# Patient Record
Sex: Female | Born: 1985 | Race: White | Hispanic: No | Marital: Married | Smoking: Former smoker
Health system: Southern US, Community
[De-identification: ages and names within clinical notes are randomized; demographics above are authoritative.]

## PROBLEM LIST (undated history)

## (undated) ENCOUNTER — Inpatient Hospital Stay: Payer: Self-pay

## (undated) ENCOUNTER — Inpatient Hospital Stay (HOSPITAL_COMMUNITY): Payer: Self-pay

## (undated) DIAGNOSIS — R519 Headache, unspecified: Secondary | ICD-10-CM

## (undated) DIAGNOSIS — R51 Headache: Secondary | ICD-10-CM

## (undated) HISTORY — DX: Headache, unspecified: R51.9

## (undated) HISTORY — DX: Headache: R51

## (undated) HISTORY — PX: OVARIAN CYST REMOVAL: SHX89

## (undated) HISTORY — PX: TONSILLECTOMY: SUR1361

## (undated) HISTORY — PX: BREAST EXCISIONAL BIOPSY: SUR124

---
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
## 2007-07-29 ENCOUNTER — Ambulatory Visit: Payer: Self-pay | Admitting: General Surgery

## 2007-09-07 ENCOUNTER — Ambulatory Visit: Payer: Self-pay | Admitting: Unknown Physician Specialty

## 2007-09-08 ENCOUNTER — Ambulatory Visit: Payer: Self-pay | Admitting: Unknown Physician Specialty

## 2010-01-30 ENCOUNTER — Ambulatory Visit: Payer: Self-pay | Admitting: Obstetrics & Gynecology

## 2010-02-05 ENCOUNTER — Ambulatory Visit (HOSPITAL_COMMUNITY): Admission: RE | Admit: 2010-02-05 | Discharge: 2010-02-05 | Payer: Self-pay | Admitting: Family Medicine

## 2010-02-15 ENCOUNTER — Ambulatory Visit: Payer: Self-pay | Admitting: Obstetrics & Gynecology

## 2010-09-04 NOTE — Assessment & Plan Note (Signed)
[name], [name]                ACCOUNT NO.:  [id number]   MEDICAL RECORD NO.:  [id number]          PATIENT TYPE:  POB   LOCATION:  [facility] at [facility]         FACILITY:  [facility]   PHYSICIAN:  Scheryl Darter, MD       DATE OF BIRTH:  [birth year]   DATE OF SERVICE:  02/15/2010                                  CLINIC NOTE   CHIEF COMPLAINT:  Lower abdominal pain.   The patient is 25 year old gravida 0 who occasionally has some light  spotting which may represent a period with Mirena in place.  She has  pain that occurs about every 4 weeks and fairly severe, sometimes she  takes Excedrin or ibuprofen.  Ultrasound was performed at Dr. Ellin Saba  request.  The intrauterine device was in the expected location and  ovaries appeared normal.  I discussed the patient's symptoms which may  be ovulatory pain.  Recommend that she use ibuprofen or Aleve for this  pain and she should notify us if pain becomes more debilitating.  She  will return for yearly exams.      Scheryl Darter, MD     JA/MEDQ  D:  02/15/2010  T:  02/15/2010  Job:  [id number]

## 2010-09-04 NOTE — Assessment & Plan Note (Signed)
NAMEMELISIA, LEMING                ACCOUNT NO.:  [id number]   MEDICAL RECORD NO.:  [id number]          PATIENT TYPE:  POB   LOCATION:  [facility] at [facility]         FACILITY:  [facility]   PHYSICIAN:  Allie Bossier, MD        DATE OF BIRTH:  Jan 20, 1986   DATE OF SERVICE:  01/30/2010                                  CLINIC NOTE   Brenda Gutierrez is a 25 year old single white gravida 0 who comes in here for  annual exam.  Her complaint today is bilateral lower pelvic pain for  last 3 months.  She says it feels like an ovarian cyst.  She has had  previous ovarian cyst, one of which required a laparoscopy and ovarian  cystectomy in 2009 by a physician at [facility].   PAST MEDICAL HISTORY:  Morbid obesity.   PAST SURGICAL HISTORY:  As above plus right breast biopsy in 2004.  Also  in 2004, she had removed what sounds like either a parathyroid or a  thyroid gland.  Please note, she does not take thyroid medicine.  Tonsillectomy and bilateral tympanostomy x3.   ALLERGIES:  No known drug allergies.   SOCIAL HISTORY:  She drinks alcohol rarely.   FAMILY HISTORY:  Positive for breast cancer in a maternal great-aunt,  but no gynecologic or colon cancer in her family.   MEDICATIONS:  She has a Mirena IUD in place.   PHYSICAL EXAMINATION:  GENERAL:  Well-nourished, well-hydrated, pleasant  white female.  VITAL SIGNS:  Weight 212 pounds.  HEENT:  Normal.  BREAST:  Normal bilaterally.  Her right nipple is inverted.  Left nipple  is flat.  No other nipple abnormalities, nipple discharge, skin changes,  or palpable masses.  HEART:  Regular rate and rhythm.  LUNGS:  Clear to auscultation bilaterally.  ABDOMEN:  Obese, benign.  No palpable hepatosplenomegaly.  EXTERNAL GENITALIA:  No lesions.  Cervix, IUD string seen.  Bimanual  exam, this exam is somewhat limited by her morbid centripetal obesity,  but I do not appreciate any masses of either her adnexa or uterus.  There does not appear to be any  tenderness on exam.   ASSESSMENT AND PLAN:  1. Annual exam.  I have checked Pap smear.  Recommend self-breast and      self-vulvar exams.  2. Bilateral pelvic pain.  I am ordering a gynecologic ultrasound.  I      will have her followup after that.      Allie Bossier, MD     MCD/MEDQ  D:  01/30/2010  T:  01/31/2010  Job:  [id number]

## 2011-04-13 ENCOUNTER — Ambulatory Visit: Payer: Self-pay

## 2012-10-12 ENCOUNTER — Ambulatory Visit (INDEPENDENT_AMBULATORY_CARE_PROVIDER_SITE_OTHER): Payer: 59 | Admitting: Obstetrics and Gynecology

## 2012-10-12 ENCOUNTER — Encounter: Payer: Self-pay | Admitting: Obstetrics and Gynecology

## 2012-10-12 VITALS — BP 120/73 | HR 75 | Ht 64.0 in | Wt 233.0 lb

## 2012-10-12 DIAGNOSIS — Z3049 Encounter for surveillance of other contraceptives: Secondary | ICD-10-CM

## 2012-10-12 DIAGNOSIS — Z30432 Encounter for removal of intrauterine contraceptive device: Secondary | ICD-10-CM

## 2012-10-12 DIAGNOSIS — Z3009 Encounter for other general counseling and advice on contraception: Secondary | ICD-10-CM

## 2012-10-12 MED ORDER — MEDROXYPROGESTERONE ACETATE 150 MG/ML IM SUSP: 150.0000 mg | INTRAMUSCULAR | Status: DC

## 2012-10-12 MED ORDER — MEDROXYPROGESTERONE ACETATE 150 MG/ML IM SUSP
150.0000 mg | INTRAMUSCULAR | Status: DC
  Administered 2012-10-12: 150 mg via INTRAMUSCULAR

## 2012-10-12 NOTE — Progress Notes (Signed)
Patient ID: Brenda Gutierrez, female   DOB: [birth year], 27 y.o.   MRN: [id number] Patient presents today requesting IUD removal. Patient has had it in place for nearly 5 years. She reports over the past 6 months severe mood swings which she attributes to the IUD and desires to try depo-provera instead. Patient is contemplating conception in 1 year  IUD removal  After informed consent was obtained, a speculum was placed in the vagina. IUD strings visualized at os and grasped with a ring forceps. IUD was removed without difficulty. Patient tolerated the procedure well.  Patient to receive first dose of depo-provera today. Patient advised to use back-up birth control for the next 2-3 weeks RTC in 3 months for depo-provera Patient advised to start prenatal vitamins a few months prior to trying to conceive

## 2012-10-14 ENCOUNTER — Ambulatory Visit: Payer: Self-pay | Admitting: Family Medicine

## 2013-01-04 ENCOUNTER — Ambulatory Visit (INDEPENDENT_AMBULATORY_CARE_PROVIDER_SITE_OTHER): Payer: 59 | Admitting: *Deleted

## 2013-01-04 DIAGNOSIS — Z3042 Encounter for surveillance of injectable contraceptive: Secondary | ICD-10-CM

## 2013-01-04 DIAGNOSIS — Z3049 Encounter for surveillance of other contraceptives: Secondary | ICD-10-CM

## 2013-01-04 MED ORDER — MEDROXYPROGESTERONE ACETATE 150 MG/ML IM SUSP: 150.0000 mg | INTRAMUSCULAR | Status: DC

## 2013-01-04 NOTE — Progress Notes (Signed)
Patient was told to come to the office to pick up Depo Provera injection rather than calling it into her pharmacy.  I am unsure of why this was told to her but we will give her an injection today from the office and further refills will be called into her pharmacy for her to pick up.  Patient administers the injection herself so there will not be any injection fee today, the only charge is for the Depo Provera injection itself.  Further meds will be called into the pharmacy and picked up by the patient and injections given by the patient.  She is not due to follow up until next year in June.

## 2014-01-04 DIAGNOSIS — G43019 Migraine without aura, intractable, without status migrainosus: Secondary | ICD-10-CM

## 2014-01-04 HISTORY — DX: Migraine without aura, intractable, without status migrainosus: G43.019

## 2014-12-15 ENCOUNTER — Encounter: Payer: Self-pay | Admitting: Obstetrics and Gynecology

## 2014-12-15 ENCOUNTER — Ambulatory Visit (INDEPENDENT_AMBULATORY_CARE_PROVIDER_SITE_OTHER): Payer: BLUE CROSS/BLUE SHIELD | Admitting: Obstetrics and Gynecology

## 2014-12-15 VITALS — BP 116/72 | HR 88 | Resp 16 | Ht 64.0 in | Wt 221.9 lb

## 2014-12-15 DIAGNOSIS — N912 Amenorrhea, unspecified: Secondary | ICD-10-CM | POA: Diagnosis not present

## 2014-12-15 LAB — POCT URINE PREGNANCY: PREG TEST UR: POSITIVE — AB

## 2014-12-15 NOTE — Progress Notes (Signed)
Subjective:    Brenda Gutierrez is a 29 y.o. P0 female who presents for evaluation of amenorrhea. She believes she could be pregnant. Pregnancy is desired. Sexual Activity: single partner, contraception: none. Current symptoms also include: breast tenderness, fatigue, morning sickness, nausea and positive home pregnancy test. Last period was normal.   Patient's last menstrual period was 10/25/2014 (exact date).   History reviewed. No pertinent past medical history.  Past Surgical History  Procedure Laterality Date  . Tonsillectomy     Family History  Problem Relation Age of Onset  . Hypertension Paternal Grandfather   . Hyperlipidemia Paternal Grandfather   . Hypertension Paternal Grandmother   . Hyperlipidemia Paternal Grandmother   . Depression Father   . Hypertension Father   . Diabetes Father   . Bipolar disorder Father   . Hyperlipidemia Father   . Diabetes Mother   . Depression Mother   . Anxiety disorder Mother   . Breast cancer Paternal Aunt     PRN Meds:. Allergies  Allergen Reactions  . Insect Extract Anaphylaxis  . Penicillin G Anaphylaxis  . Penicillins Anaphylaxis  . Amoxicillin-Pot Clavulanate Hives    Review of Systems Pertinent items are noted in HPI.     Objective:    BP 116/72 mmHg  Pulse 88  Resp 16  Ht  (1.626 m)  Wt 221 lb 14.4 oz (100.653 kg)  BMI 38.07 kg/m2  LMP 10/25/2014 (Exact Date) General: alert, no distress and no acute distress    Lab Review Urine HCG: positive    Assessment:    Absence of menstruation.     Plan:    Pregnancy Test: Positive: EDC: 08/01/2015. Briefly discussed pre-natal care options.Encouraged well-balanced diet, plenty of rest when needed, pre-natal vitamins daily and walking for exercise. Discussed self-help for nausea, avoiding OTC medications until consulting provider or pharmacist, other than Tylenol as needed, minimal caffeine (1-2 cups daily) and avoiding alcohol. She will schedule her initial OB  visit in the next month with her PCP or OB provider. Feel free to call with any questions. Will order ultrasound to confirm viability in next 1-2 weeks.     Hildred Laser, MD [facility]

## 2014-12-27 ENCOUNTER — Ambulatory Visit: Payer: BLUE CROSS/BLUE SHIELD

## 2014-12-27 DIAGNOSIS — N912 Amenorrhea, unspecified: Secondary | ICD-10-CM | POA: Diagnosis not present

## 2015-01-05 ENCOUNTER — Other Ambulatory Visit: Payer: Self-pay | Admitting: Obstetrics and Gynecology

## 2015-01-05 ENCOUNTER — Ambulatory Visit (INDEPENDENT_AMBULATORY_CARE_PROVIDER_SITE_OTHER): Payer: BLUE CROSS/BLUE SHIELD | Admitting: Obstetrics and Gynecology

## 2015-01-05 VITALS — BP 112/74 | HR 96 | Wt 220.2 lb

## 2015-01-05 DIAGNOSIS — R638 Other symptoms and signs concerning food and fluid intake: Secondary | ICD-10-CM

## 2015-01-05 DIAGNOSIS — T7589XA Other specified effects of external causes, initial encounter: Secondary | ICD-10-CM

## 2015-01-05 DIAGNOSIS — Z113 Encounter for screening for infections with a predominantly sexual mode of transmission: Secondary | ICD-10-CM

## 2015-01-05 DIAGNOSIS — Z1389 Encounter for screening for other disorder: Secondary | ICD-10-CM

## 2015-01-05 DIAGNOSIS — Z331 Pregnant state, incidental: Secondary | ICD-10-CM

## 2015-01-05 DIAGNOSIS — Z36 Encounter for antenatal screening of mother: Secondary | ICD-10-CM

## 2015-01-05 DIAGNOSIS — Z369 Encounter for antenatal screening, unspecified: Secondary | ICD-10-CM

## 2015-01-05 NOTE — Patient Instructions (Signed)

## 2015-01-05 NOTE — Progress Notes (Signed)
  Lavonna Rua for NOB nurse interview visit. G-1.   P-0. Pregnancy eduction material explained and given. There is a cat in the home, although it usually stays outside. Pt states she has not changed litter box in several months. Toxoplamosis labs ordered. Pt agreed, cost from [facility] was reported est. $91.00.  NOB labs ordered. TSH/HbgA1c due to Increased BMI, HIV labs ordered. Due to high deductable, Drug screen was declined. HIV lab and drug screen were explained optional and she could opt out of tests but did not decline HIV.  PNV encouraged. NT to discuss with provider. Pt also questioned if she could take Iodine supplement. I spoke with Dr. Algis Downs and she may take . Maximum dose.  She takes this as suggested by her PCP for abnormal lower body temp., sometimes Temp. 95. Pt has fever, and severe fatigue when these episodes occur.  This also may depend on cost. Pt. To follow up with provider in 1-2 weeks for NOB physical.  All questions answered.  ZIKA EXPOSURE SCREEN:  The patient has not traveled to a [place] Virus endemic area within the past 6 months, nor has she had unprotected sex with a partner who has travelled to a [place] endemic region within the past 6 months. The patient has been advised to notify us if these factors change any time during this current pregnancy, so adequate testing and monitoring can be initiated.

## 2015-01-06 LAB — ABO/RH: RH TYPE: POSITIVE

## 2015-01-06 LAB — UA/M W/RFLX CULTURE, COMP

## 2015-01-06 LAB — GC/CHLAMYDIA PROBE AMP
Chlamydia trachomatis, NAA: NEGATIVE
Neisseria gonorrhoeae by PCR: NEGATIVE

## 2015-01-06 LAB — HIV ANTIBODY (ROUTINE TESTING W REFLEX): HIV Screen 4th Generation wRfx: NONREACTIVE

## 2015-01-06 LAB — MICROSCOPIC EXAMINATION: Casts: NONE SEEN /lpf

## 2015-01-06 LAB — CBC WITH DIFFERENTIAL/PLATELET
BASOS: 0 %
Basophils Absolute: 0 10*3/uL (ref 0.0–0.2)
EOS (ABSOLUTE): 0.1 10*3/uL (ref 0.0–0.4)
Eos: 1 %
Hematocrit: 42.2 % (ref 34.0–46.6)
Hemoglobin: 13.6 g/dL (ref 11.1–15.9)
Immature Grans (Abs): 0 10*3/uL (ref 0.0–0.1)
Immature Granulocytes: 0 %
LYMPHS ABS: 2.6 10*3/uL (ref 0.7–3.1)
Lymphs: 19 %
MCH: 29 pg (ref 26.6–33.0)
MCHC: 32.2 g/dL (ref 31.5–35.7)
MCV: 90 fL (ref 79–97)
MONOS ABS: 0.7 10*3/uL (ref 0.1–0.9)
Monocytes: 5 %
NEUTROS ABS: 9.8 10*3/uL — AB (ref 1.4–7.0)
NEUTROS PCT: 75 %
PLATELETS: 312 10*3/uL (ref 150–379)
RBC: 4.69 x10E6/uL (ref 3.77–5.28)
RDW: 13.5 % (ref 12.3–15.4)
WBC: 13.3 10*3/uL — ABNORMAL HIGH (ref 3.4–10.8)

## 2015-01-06 LAB — RUBELLA SCREEN: Rubella Antibodies, IGG: <0.9 index — ABNORMAL LOW (ref >0.99)

## 2015-01-06 LAB — TOXOPLASMA GONDII ANTIBODY, IGG

## 2015-01-06 LAB — HEPATITIS B SURFACE ANTIGEN: HEP B S AG: NEGATIVE

## 2015-01-06 LAB — RPR QUALITATIVE: RPR: NONREACTIVE

## 2015-01-06 LAB — HGB A1C W/O EAG: HEMOGLOBIN A1C: 5.5 % (ref 4.8–5.6)

## 2015-01-06 LAB — ANTIBODY SCREEN: Antibody Screen: NEGATIVE

## 2015-01-06 LAB — URINE CULTURE

## 2015-01-06 LAB — TSH: TSH: 1.46 u[IU]/mL (ref 0.450–4.500)

## 2015-01-07 LAB — URINE CULTURE

## 2015-01-09 LAB — VARICELLA ZOSTER ANTIBODY, IGM: Varicella IgM: 1.06 index — ABNORMAL HIGH (ref 0.00–0.90)

## 2015-01-09 LAB — URINE CULTURE, COMPREHENSIVE

## 2015-01-10 LAB — UA/M W/RFLX CULTURE, COMP

## 2015-01-20 ENCOUNTER — Ambulatory Visit (INDEPENDENT_AMBULATORY_CARE_PROVIDER_SITE_OTHER): Payer: BLUE CROSS/BLUE SHIELD | Admitting: Obstetrics and Gynecology

## 2015-01-20 ENCOUNTER — Encounter: Payer: Self-pay | Admitting: Obstetrics and Gynecology

## 2015-01-20 VITALS — BP 120/69 | HR 82 | Wt 222.0 lb

## 2015-01-20 DIAGNOSIS — O99210 Obesity complicating pregnancy, unspecified trimester: Secondary | ICD-10-CM | POA: Insufficient documentation

## 2015-01-20 DIAGNOSIS — Z3491 Encounter for supervision of normal pregnancy, unspecified, first trimester: Secondary | ICD-10-CM | POA: Insufficient documentation

## 2015-01-20 DIAGNOSIS — Z3492 Encounter for supervision of normal pregnancy, unspecified, second trimester: Secondary | ICD-10-CM

## 2015-01-20 DIAGNOSIS — E669 Obesity, unspecified: Secondary | ICD-10-CM

## 2015-01-20 LAB — POCT URINALYSIS DIPSTICK
BILIRUBIN UA: NEGATIVE
GLUCOSE UA: NEGATIVE
Ketones, UA: NEGATIVE
Leukocytes, UA: NEGATIVE
Nitrite, UA: NEGATIVE
RBC UA: NEGATIVE
SPEC GRAV UA: 1.01
UROBILINOGEN UA: 0.2
pH, UA: 7

## 2015-01-20 NOTE — Progress Notes (Signed)
New OB History and Physical  Subjective:    Brenda Gutierrez is being seen today for her first obstetrical visit.  This is a planned pregnancy. She is a G1P0 married female at [redacted]w[redacted]d gestation by last menstrual period of 10/25/2014 (exact date), with Estimated Date of Delivery: 08/01/15 (consistent with 8 week sono). Her obstetrical history is significant for obesity. Relationship with FOB: spouse, living together. Patient does intend to breast feed. Pregnancy history fully reviewed.  Menstrual History: OB History  Gravida Para Term Preterm AB SAB TAB Ectopic Multiple Living  1             # Outcome Date GA Lbr Len/2nd Weight Sex Delivery Anes PTL Lv  1 Current              Menarche age: 3  Patient's last menstrual period was 10/25/2014 (exact date).  Denies h/o STIs or abnormal pap smears.  Patient cannot recall date of last pap smear (possibly 2-3 years ago).    Past Medical History  Diagnosis Date  . Headache     migraines    Past Surgical History  Procedure Laterality Date  . Tonsillectomy    . 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 Tumor removal  2005    right breast-benign    Family History  Problem Relation Age of Onset  . Hypertension Paternal Grandfather   . Hyperlipidemia Paternal Grandfather   . Hypertension Paternal Grandmother   . Hyperlipidemia Paternal Grandmother   . Depression Father   . Hypertension Father   . Diabetes Father   . Bipolar disorder Father   . Hyperlipidemia Father   . Diabetes Mother   . Depression Mother   . Anxiety disorder Mother   . Breast cancer Paternal Aunt     Social History   Social History  . Marital Status: Married    Spouse Name: N/A  . Number of Children: N/A  . Years of Education: N/A   Occupational History  . fitness Psychologist, clinical   Social History Main Topics  . Smoking status: Former Games developer  . Smokeless tobacco: Never Used  . Alcohol Use: No     Comment: social  . Drug Use: No  . Sexual  Activity:    Partners: Male   Other Topics Concern  . Not on file   Social History Narrative    Current Outpatient Prescriptions on File Prior to Visit  Medication Sig Dispense Refill  . EPINEPHrine 0.3 mg/0.3 mL IJ SOAJ injection Inject into the muscle.    . Prenatal Vit-Fe Fumarate-FA (MULTIVITAMIN-PRENATAL) 27-0.8 MG TABS tablet Take 1 tablet by mouth daily at 12 noon.     No current facility-administered medications on file prior to visit.    Allergies  Allergen Reactions  . Insect Extract Anaphylaxis  . Penicillin G Anaphylaxis  . Penicillins Anaphylaxis  . Amoxicillin-Pot Clavulanate Hives    Review of Systems General:Not Present- Fever, Weight Loss and Weight Gain. Skin:Not Present- Rash. HEENT:Not Present- Blurred Vision, Headache and Bleeding Gums. Respiratory:Not Present- Difficulty Breathing. Breast:Not Present- Breast Mass. Cardiovascular:Not Present- Chest Pain, Elevated Blood Pressure, Fainting / Blacking Out and Shortness of Breath. Gastrointestinal:Present - Nausea and Vomiting (mild, resolving). Not Present- Abdominal Pain, Constipation. Female Genitourinary:Not Present- Frequency, Painful Urination, Pelvic Pain, Vaginal Bleeding, Vaginal Discharge, Contractions, regular, Fetal Movements Decreased, Urinary Complaints and Vaginal Fluid. Musculoskeletal:Not Present- Back Pain and Leg Cramps. Neurological:Not Present- Dizziness. Psychiatric:Not Present- Depression.    Objective:  Blood pressure 120/69, pulse 82, weight 222 lb (100.699 kg), last menstrual period 10/25/2014. Body mass index is 38.09 kg/(m^2).   General Appearance:    Alert, cooperative, no distress, appears stated age, obese  Head:    Normocephalic, without obvious abnormality, atraumatic  Eyes:    PERRL, conjunctiva/corneas clear, EOM's intact, both eyes  Ears:    Normal external ear canals, both ears  Nose:   Nares normal, septum midline, mucosa normal, no drainage or sinus  tenderness  Throat:   Lips, mucosa, and tongue normal; teeth and gums normal  Neck:   Supple, symmetrical, trachea midline, no adenopathy; thyroid: no enlargement/tenderness/nodules; no carotid bruit or JVD  Back:     Symmetric, no curvature, ROM normal, no CVA tenderness  Lungs:     Clear to auscultation bilaterally, respirations unlabored  Chest Wall:    No tenderness or deformity   Heart:    Regular rate and rhythm, S1 and S2 normal, no murmur, rub or gallop  Breast Exam:    No tenderness, masses, or nipple abnormality  Abdomen:     Soft, non-tender, bowel sounds active all four quadrants, no masses, no organomegaly.  FHT 153  bpm.  Genitalia:    Pelvic:external genitalia normal, vagina with lesions, discharge, or tenderness, rectovaginal septum  normal. Cervix normal in appearance, no cervical motion tenderness, no adnexal masses or tenderness.  Pregnancy positive findings: uterine enlargement: 12 week size, nontender.   Rectal:    Normal external sphincter.  No hemorrhoids appreciated. Internal exam not done.   Extremities:   Extremities normal, atraumatic, no cyanosis or edema  Pulses:   2+ and symmetric all extremities  Skin:   Skin color, texture, turgor normal, no rashes or lesions  Lymph nodes:   Cervical, supraclavicular, and axillary nodes normal  Neurologic:   CNII-XII intact, normal strength, sensation and reflexes throughout     Assessment:   Pregnancy at 12 and 5/7 weeks   Obesity H/o migraines   Plan:   Initial labs reviewed.  Pap smear performed today.  Prenatal vitamins. Problem list reviewed and updated. AFP3 discussed: undecided (due to insurance and cost). If unable to perform 1st trimester screen, to discuss further next visit (can perform 2nd trimester screen or Panor[facility] at that time).  Needs early glucola for screening for diabetes due to obesity.  H/o migraines.  Currently asymptomatic.  Advised that migraine frequency and intensity can change in pregnancy, to  notify MD if symptoms worsen.  Follow up in 4 weeks.

## 2015-01-20 NOTE — Progress Notes (Signed)
NOB- pt denies any changes

## 2015-01-24 ENCOUNTER — Other Ambulatory Visit: Payer: BLUE CROSS/BLUE SHIELD

## 2015-01-25 LAB — PAP IG AND HPV HIGH-RISK: PAP SMEAR COMMENT: 0

## 2015-01-26 DIAGNOSIS — O09899 Supervision of other high risk pregnancies, unspecified trimester: Secondary | ICD-10-CM

## 2015-01-26 DIAGNOSIS — Z283 Underimmunization status: Secondary | ICD-10-CM

## 2015-01-26 DIAGNOSIS — O9989 Other specified diseases and conditions complicating pregnancy, childbirth and the puerperium: Secondary | ICD-10-CM

## 2015-02-03 ENCOUNTER — Encounter: Payer: Self-pay | Admitting: Obstetrics and Gynecology

## 2015-02-06 ENCOUNTER — Encounter: Payer: Self-pay | Admitting: Obstetrics and Gynecology

## 2015-02-06 ENCOUNTER — Telehealth: Payer: Self-pay | Admitting: *Deleted

## 2015-02-06 NOTE — Telephone Encounter (Signed)
Called [facility], had gender added, pt was notified

## 2015-02-06 NOTE — Telephone Encounter (Signed)
-----   Message from Hildred LaserAnika Cherry, MD sent at 02/04/2015 10:47 AM EDT ----- Nickolaus Bordelon, the fetal sex was not reported  i do believe the patient desired this information.  Can we call the [facility] lab and add this?  Thanks!   Dr. Valentino Saxonherry

## 2015-02-14 ENCOUNTER — Other Ambulatory Visit: Payer: Self-pay

## 2015-02-14 ENCOUNTER — Ambulatory Visit (INDEPENDENT_AMBULATORY_CARE_PROVIDER_SITE_OTHER): Payer: BLUE CROSS/BLUE SHIELD | Admitting: Obstetrics and Gynecology

## 2015-02-14 ENCOUNTER — Other Ambulatory Visit: Payer: BLUE CROSS/BLUE SHIELD

## 2015-02-14 VITALS — BP 138/80 | HR 103 | Wt 222.4 lb

## 2015-02-14 DIAGNOSIS — Z3492 Encounter for supervision of normal pregnancy, unspecified, second trimester: Secondary | ICD-10-CM

## 2015-02-14 DIAGNOSIS — Z23 Encounter for immunization: Secondary | ICD-10-CM

## 2015-02-14 DIAGNOSIS — Z3482 Encounter for supervision of other normal pregnancy, second trimester: Secondary | ICD-10-CM

## 2015-02-14 DIAGNOSIS — O99210 Obesity complicating pregnancy, unspecified trimester: Secondary | ICD-10-CM

## 2015-02-14 DIAGNOSIS — Z3402 Encounter for supervision of normal first pregnancy, second trimester: Secondary | ICD-10-CM

## 2015-02-14 DIAGNOSIS — E669 Obesity, unspecified: Secondary | ICD-10-CM

## 2015-02-14 DIAGNOSIS — Z1379 Encounter for other screening for genetic and chromosomal anomalies: Secondary | ICD-10-CM

## 2015-02-14 DIAGNOSIS — Z3403 Encounter for supervision of normal first pregnancy, third trimester: Secondary | ICD-10-CM | POA: Insufficient documentation

## 2015-02-14 LAB — POCT URINALYSIS DIP (MANUAL ENTRY)
BILIRUBIN UA: NEGATIVE
GLUCOSE UA: NEGATIVE
Ketones, POC UA: NEGATIVE
Leukocytes, UA: NEGATIVE
NITRITE UA: NEGATIVE
PH UA: 6
Protein Ur, POC: NEGATIVE
RBC UA: NEGATIVE
SPEC GRAV UA: 1.015
Urobilinogen, UA: 0.2

## 2015-02-14 NOTE — Progress Notes (Signed)
ROB: Doing well, no complaints.  Performing early glucola today.  For flu vaccine today. RTC in 4 weeks. For anatomy scan at that time.

## 2015-02-15 LAB — GLUCOSE, 1 HOUR GESTATIONAL: Gestational Diabetes Screen: 76 mg/dL (ref 65–139)

## 2015-02-16 ENCOUNTER — Other Ambulatory Visit: Payer: BLUE CROSS/BLUE SHIELD

## 2015-02-21 LAB — AFP, SERUM, OPEN SPINA BIFIDA
AFP MOM: 1.49
AFP VALUE AFPOSL: 39.5 ng/mL
Gest. Age on Collection Date: 16.3 weeks
MATERNAL AGE AT EDD: 30.4 a
OSBR Risk 1 IN: 2809
Test Results:: NEGATIVE
Weight: 220 [lb_av]

## 2015-03-15 ENCOUNTER — Other Ambulatory Visit: Payer: BLUE CROSS/BLUE SHIELD

## 2015-03-15 ENCOUNTER — Ambulatory Visit (INDEPENDENT_AMBULATORY_CARE_PROVIDER_SITE_OTHER): Payer: BLUE CROSS/BLUE SHIELD | Admitting: Obstetrics and Gynecology

## 2015-03-15 VITALS — BP 111/70 | HR 90 | Wt 230.4 lb

## 2015-03-15 DIAGNOSIS — O99210 Obesity complicating pregnancy, unspecified trimester: Secondary | ICD-10-CM

## 2015-03-15 DIAGNOSIS — Z3492 Encounter for supervision of normal pregnancy, unspecified, second trimester: Secondary | ICD-10-CM

## 2015-03-15 LAB — POCT URINALYSIS DIPSTICK
BILIRUBIN UA: NEGATIVE
Glucose, UA: NEGATIVE
KETONES UA: NEGATIVE
Leukocytes, UA: NEGATIVE
NITRITE UA: NEGATIVE
PH UA: 6
Protein, UA: NEGATIVE
RBC UA: NEGATIVE
Spec Grav, UA: >=1.03
Urobilinogen, UA: NEGATIVE

## 2015-03-15 NOTE — Progress Notes (Signed)
ROB: Doing well.  Notes that she was concerned regarding weight gain as she had not been gaining much weight over past few visits, however now is ok with current weight gain.  Also notes itching on back near area of bra strap.  Scaling peeling skin noted, advised on Aveeno or cocoa butter to area.  RTC in 1 week for anatomy scan in 4 weeks for Ob visit.

## 2015-03-21 ENCOUNTER — Ambulatory Visit (INDEPENDENT_AMBULATORY_CARE_PROVIDER_SITE_OTHER): Payer: BLUE CROSS/BLUE SHIELD

## 2015-03-21 DIAGNOSIS — Z3482 Encounter for supervision of other normal pregnancy, second trimester: Secondary | ICD-10-CM | POA: Diagnosis not present

## 2015-03-21 DIAGNOSIS — Z3492 Encounter for supervision of normal pregnancy, unspecified, second trimester: Secondary | ICD-10-CM

## 2015-03-28 ENCOUNTER — Other Ambulatory Visit: Payer: Self-pay | Admitting: Obstetrics and Gynecology

## 2015-03-28 DIAGNOSIS — IMO0002 Reserved for concepts with insufficient information to code with codable children: Secondary | ICD-10-CM

## 2015-03-28 DIAGNOSIS — Z0489 Encounter for examination and observation for other specified reasons: Secondary | ICD-10-CM

## 2015-03-29 ENCOUNTER — Ambulatory Visit (INDEPENDENT_AMBULATORY_CARE_PROVIDER_SITE_OTHER): Payer: BLUE CROSS/BLUE SHIELD

## 2015-03-29 DIAGNOSIS — Z0489 Encounter for examination and observation for other specified reasons: Secondary | ICD-10-CM

## 2015-03-29 DIAGNOSIS — Z36 Encounter for antenatal screening of mother: Secondary | ICD-10-CM

## 2015-03-29 DIAGNOSIS — IMO0002 Reserved for concepts with insufficient information to code with codable children: Secondary | ICD-10-CM

## 2015-04-10 ENCOUNTER — Ambulatory Visit (INDEPENDENT_AMBULATORY_CARE_PROVIDER_SITE_OTHER): Payer: BLUE CROSS/BLUE SHIELD | Admitting: Certified Nurse Midwife

## 2015-04-10 ENCOUNTER — Encounter: Payer: Self-pay | Admitting: Certified Nurse Midwife

## 2015-04-10 VITALS — BP 110/74 | HR 93 | Wt 235.2 lb

## 2015-04-10 DIAGNOSIS — Z1389 Encounter for screening for other disorder: Secondary | ICD-10-CM

## 2015-04-10 DIAGNOSIS — O99210 Obesity complicating pregnancy, unspecified trimester: Secondary | ICD-10-CM

## 2015-04-10 DIAGNOSIS — Z3490 Encounter for supervision of normal pregnancy, unspecified, unspecified trimester: Secondary | ICD-10-CM

## 2015-04-10 DIAGNOSIS — Z369 Encounter for antenatal screening, unspecified: Secondary | ICD-10-CM

## 2015-04-10 DIAGNOSIS — Z331 Pregnant state, incidental: Secondary | ICD-10-CM

## 2015-04-10 DIAGNOSIS — Z36 Encounter for antenatal screening of mother: Secondary | ICD-10-CM

## 2015-04-10 LAB — POCT URINALYSIS DIPSTICK
BILIRUBIN UA: NEGATIVE
Blood, UA: NEGATIVE
Glucose, UA: NEGATIVE
Ketones, UA: NEGATIVE
NITRITE UA: NEGATIVE
PH UA: 6
PROTEIN UA: NEGATIVE
Spec Grav, UA: 1.025
Urobilinogen, UA: NEGATIVE

## 2015-04-10 NOTE — Patient Instructions (Signed)

## 2015-04-10 NOTE — Progress Notes (Signed)
Pt took a new medication as stated in nursing note and began itching and having a rash. Rash only visible on hands and no where else. Pt advised to stop medication and to continue cortizone cream TID and use benadryl PRN.  If benadryl causes too much somnolence may use zyrtec.  Pt to return in 4 weeks for ROB and GTT.  Discussed diet & exercise and weight gain.  Pt denies headache, visual disturbances or epigastric pain.

## 2015-04-10 NOTE — Progress Notes (Signed)
Pt presents for rash/itching of hands (also swollen), stomach, top of upper legs. Tried Benadryl po which did not help nor the Cortizone cream, much.  Pt has been sick and recently took Robitussin DM, cold and chest congestion. This contains Dextromethorphan HBr 20mg . And Guaifenesin 200mg . Pt took this 2x on Saturday and started itching on Saturday night. Head cold has gone into chest. Cough is non-productive. Temp. 97.3 Oral.

## 2015-04-12 ENCOUNTER — Other Ambulatory Visit: Payer: Self-pay | Admitting: Obstetrics and Gynecology

## 2015-04-12 DIAGNOSIS — IMO0002 Reserved for concepts with insufficient information to code with codable children: Secondary | ICD-10-CM

## 2015-04-12 DIAGNOSIS — Z0489 Encounter for examination and observation for other specified reasons: Secondary | ICD-10-CM

## 2015-04-13 ENCOUNTER — Ambulatory Visit (INDEPENDENT_AMBULATORY_CARE_PROVIDER_SITE_OTHER): Payer: BLUE CROSS/BLUE SHIELD

## 2015-04-13 ENCOUNTER — Ambulatory Visit (INDEPENDENT_AMBULATORY_CARE_PROVIDER_SITE_OTHER): Payer: BLUE CROSS/BLUE SHIELD | Admitting: Obstetrics and Gynecology

## 2015-04-13 ENCOUNTER — Encounter: Payer: Self-pay | Admitting: Obstetrics and Gynecology

## 2015-04-13 VITALS — BP 113/70 | HR 109 | Wt 235.9 lb

## 2015-04-13 DIAGNOSIS — Z3493 Encounter for supervision of normal pregnancy, unspecified, third trimester: Secondary | ICD-10-CM

## 2015-04-13 DIAGNOSIS — Z36 Encounter for antenatal screening of mother: Secondary | ICD-10-CM

## 2015-04-13 DIAGNOSIS — Z0489 Encounter for examination and observation for other specified reasons: Secondary | ICD-10-CM

## 2015-04-13 DIAGNOSIS — IMO0002 Reserved for concepts with insufficient information to code with codable children: Secondary | ICD-10-CM

## 2015-04-13 LAB — POCT URINALYSIS DIPSTICK
BILIRUBIN UA: NEGATIVE
Blood, UA: NEGATIVE
GLUCOSE UA: NEGATIVE
KETONES UA: NEGATIVE
LEUKOCYTES UA: NEGATIVE
Nitrite, UA: NEGATIVE
Spec Grav, UA: 1.015
Urobilinogen, UA: 0.2
pH, UA: 5

## 2015-04-13 NOTE — Progress Notes (Signed)
ROB- follow-up anatomy done, still has rash on her hands

## 2015-04-13 NOTE — Progress Notes (Signed)
Rob- rash barely visible, reassured, scan complete today and reveals the following: Indications:F/U Anatomy Findings:  Follow Up to complete anatomy: spine only Fetal presentation is Vertex. . Placenta: Anterior, grade 0, remote to cervix by 6.8cm. AFI: Adequate with MVP of 5.5cm.  Anatomic survey is complete and normal; Gender - female  .   Survey of the adnexa demonstrates no adnexal masses. There is no free peritoneal fluid in the cul de sac.  Impression: 1. Spine appears normal   Glucola next visit.

## 2015-04-23 NOTE — L&D Delivery Note (Signed)
Delivery Summary for Brenda Gutierrez  Labor Events:   Preterm labor:   Rupture date:   Rupture time:   Rupture type: Possible ROM - for evaluation  Fluid Color:   Induction:   Augmentation:   Complications:   Cervical ripening:          Delivery:   Episiotomy:   Lacerations:   Repair suture:   Repair # of packets:   Blood loss (ml): 600    Information for the patient's newborn:  Brenda Gutierrez, Brenda Gutierrez [[id number]][[id number]]    Delivery 07/21/2015 9:07 PM by  C-Section, Low Vertical Sex:  female Gestational Age: 7286w3d Delivery Clinician:  Hildred LaserAnika Quinlan Mcfall Living?: Yes        APGARS  One minute Five minutes Ten minutes  Skin color: 0   1      Heart rate: 2   2      Grimace: 2   2      Muscle tone: 2   2      Breathing: 2   2      Totals: 8  9      Presentation/position: Vertex     Resuscitation: None  Cord information: 3 vessels   Disposition of cord blood: Yes    Blood gases sent?  Complications: None  Placenta: Delivered: 07/21/2015 9:09 PM  Manual removal  Intact appearance Newborn Measurements: Weight: 7 lb 4.8 oz (3310 g)  Height: 20.5"  Head circumference:    Chest circumference:    Other providers: Delivery Assist Delivery Nurse Transition RN Neonatologist Daphine DeutscherMartin A Defrancesco Geronimo BootAshley E Daniels Donna B Grubbs Peggy A Mccracken  Additional  information: Forceps:   Vacuum:   Breech:   Observed anomalies none observed         See Dr. Oretha Milchherry's Operative Note for details of procedure   Hildred LaserAnika Itzabella Sorrels, MD [facility]

## 2015-05-02 ENCOUNTER — Telehealth: Payer: Self-pay | Admitting: Obstetrics and Gynecology

## 2015-05-02 NOTE — Telephone Encounter (Signed)
Pt is [redacted] wk pregnant. She wanted to inform Dr. Valentino Saxonherry that she fell on the ice. She fell on her butt, no spotting, just a lil cramping she said a twitch.) she said she just wanted us aware of it in case something else should arise.

## 2015-05-11 ENCOUNTER — Ambulatory Visit (INDEPENDENT_AMBULATORY_CARE_PROVIDER_SITE_OTHER): Payer: Self-pay | Admitting: Obstetrics and Gynecology

## 2015-05-11 VITALS — BP 113/73 | HR 96 | Wt 245.4 lb

## 2015-05-11 DIAGNOSIS — O99210 Obesity complicating pregnancy, unspecified trimester: Secondary | ICD-10-CM

## 2015-05-11 DIAGNOSIS — Z3403 Encounter for supervision of normal first pregnancy, third trimester: Secondary | ICD-10-CM

## 2015-05-11 LAB — POCT URINALYSIS DIPSTICK
Bilirubin, UA: NEGATIVE
Blood, UA: NEGATIVE
Glucose, UA: NEGATIVE
KETONES UA: NEGATIVE
LEUKOCYTES UA: NEGATIVE
Nitrite, UA: NEGATIVE
PH UA: 7.5
PROTEIN UA: NEGATIVE
SPEC GRAV UA: 1.01
Urobilinogen, UA: NEGATIVE

## 2015-05-13 NOTE — Progress Notes (Signed)
ROB: Patient doing well, denies complaints.  Desires to postpone Tdap and glucola until next visit in February (due to insurance purposes).  RTC in 2 weeks. To also discuss cord blood banking and contraception at next visit. Continued to counsel on limiting further weight gain in pregnancy.

## 2015-05-25 ENCOUNTER — Other Ambulatory Visit: Payer: Self-pay

## 2015-05-25 ENCOUNTER — Ambulatory Visit (INDEPENDENT_AMBULATORY_CARE_PROVIDER_SITE_OTHER): Payer: BLUE CROSS/BLUE SHIELD | Admitting: Obstetrics and Gynecology

## 2015-05-25 ENCOUNTER — Other Ambulatory Visit: Payer: Self-pay | Admitting: Obstetrics and Gynecology

## 2015-05-25 VITALS — BP 112/74 | HR 111 | Wt 253.4 lb

## 2015-05-25 DIAGNOSIS — Z23 Encounter for immunization: Secondary | ICD-10-CM | POA: Diagnosis not present

## 2015-05-25 DIAGNOSIS — Z3493 Encounter for supervision of normal pregnancy, unspecified, third trimester: Secondary | ICD-10-CM

## 2015-05-25 DIAGNOSIS — Z3483 Encounter for supervision of other normal pregnancy, third trimester: Secondary | ICD-10-CM | POA: Diagnosis not present

## 2015-05-25 LAB — POCT URINALYSIS DIPSTICK
BILIRUBIN UA: NEGATIVE
GLUCOSE UA: NEGATIVE
Ketones, UA: NEGATIVE
NITRITE UA: NEGATIVE
PH UA: 7.5
Protein, UA: NEGATIVE
Spec Grav, UA: 1.01
Urobilinogen, UA: NEGATIVE

## 2015-05-25 NOTE — Patient Instructions (Signed)

## 2015-05-25 NOTE — Progress Notes (Signed)
ROB: Notes occasional pain under sternum and right rib, feels sharp and achy. Denies SOB. Not exacerbated by food intake. Nothing makes it better or worse. Likely costochondritis.  Discussed Tylenol for pain. For glucola today. For tdap, blood consents.  Discussed cord blood banking.  RTC in 2 weeks.

## 2015-05-26 LAB — HEMOGLOBIN: HEMOGLOBIN: 11.4 g/dL (ref 11.1–15.9)

## 2015-05-26 LAB — HEMATOCRIT: HEMATOCRIT: 34.8 % (ref 34.0–46.6)

## 2015-05-26 LAB — GLUCOSE, RANDOM: GLUCOSE: 102 mg/dL — AB (ref 65–99)

## 2015-06-01 LAB — GLUCOSE, 1 HOUR GESTATIONAL: GESTATIONAL DIABETES SCREEN: 102 mg/dL (ref 65–139)

## 2015-06-01 LAB — SPECIMEN STATUS REPORT

## 2015-06-08 ENCOUNTER — Ambulatory Visit (INDEPENDENT_AMBULATORY_CARE_PROVIDER_SITE_OTHER): Payer: BLUE CROSS/BLUE SHIELD | Admitting: Obstetrics and Gynecology

## 2015-06-08 VITALS — BP 130/78 | HR 98 | Wt 254.8 lb

## 2015-06-08 DIAGNOSIS — Z3483 Encounter for supervision of other normal pregnancy, third trimester: Secondary | ICD-10-CM

## 2015-06-08 DIAGNOSIS — O99210 Obesity complicating pregnancy, unspecified trimester: Secondary | ICD-10-CM

## 2015-06-08 DIAGNOSIS — Z3493 Encounter for supervision of normal pregnancy, unspecified, third trimester: Secondary | ICD-10-CM

## 2015-06-08 LAB — POCT URINALYSIS DIPSTICK
BILIRUBIN UA: NEGATIVE
Blood, UA: NEGATIVE
Glucose, UA: NEGATIVE
Ketones, UA: NEGATIVE
NITRITE UA: NEGATIVE
PH UA: 6
PROTEIN UA: NEGATIVE
Spec Grav, UA: >=1.03
Urobilinogen, UA: NEGATIVE

## 2015-06-08 NOTE — Progress Notes (Signed)
ROB: Patient notes occasional braxton hicks.  Normal glucola and H/H.  Desires to breastfeed.  Still unsure of contraceptive desires.  To discuss at subsequent visits.  Advised on limiting further weight gain in pregnancy. RTC in 2 weeks.

## 2015-06-20 ENCOUNTER — Telehealth: Payer: Self-pay | Admitting: Obstetrics and Gynecology

## 2015-06-20 NOTE — Telephone Encounter (Signed)
PT CALLED AND SHE SUBMITTED TO THE COMPANY ABOUT HER ORDERING HER BREAST BUMP, AND THEY SAID THEY SENT OVER THE REQUEST AND THAT WAS 2 WEEKS AGO AND THE COMPANY HAS NOT RECEIVED IT, AND SHE WANTED TO CHECK WITH [place] BECAUSE THEY WILL NOT SHIP HER BREAST PUMP UNTIL THEY GET THE REQUEST BACK FROM [place]. PT WOULD LIKE A CALL BACK

## 2015-06-21 NOTE — Telephone Encounter (Signed)
Called pt no answer, LM informing pt that breast pump information was faxed in for her on the 20th of this month. If her insurance company has still not received it, they need to refax form or call me.

## 2015-06-22 ENCOUNTER — Ambulatory Visit (INDEPENDENT_AMBULATORY_CARE_PROVIDER_SITE_OTHER): Payer: BLUE CROSS/BLUE SHIELD | Admitting: Obstetrics and Gynecology

## 2015-06-22 VITALS — BP 118/69 | HR 92 | Wt 259.5 lb

## 2015-06-22 DIAGNOSIS — O99210 Obesity complicating pregnancy, unspecified trimester: Secondary | ICD-10-CM

## 2015-06-22 DIAGNOSIS — Z3403 Encounter for supervision of normal first pregnancy, third trimester: Secondary | ICD-10-CM

## 2015-06-22 LAB — POCT URINALYSIS DIPSTICK
Bilirubin, UA: NEGATIVE
Glucose, UA: NEGATIVE
Ketones, UA: NEGATIVE
NITRITE UA: NEGATIVE
PH UA: 8
PROTEIN UA: NEGATIVE
Spec Grav, UA: 1.01
Urobilinogen, UA: NEGATIVE

## 2015-06-22 NOTE — Progress Notes (Signed)
ROB: Patient notes frequent episodes of Braxton Hicks.  Does report increased pelvic pressure.  Discussed birth plan (considering hep locking IV, ambulation, declines epidural). PTL precautions given. Size greater than dates today. If still noted next visit, will order growth scan. RTC in 2 weeks.

## 2015-06-26 ENCOUNTER — Observation Stay
Admission: EM | Admit: 2015-06-26 | Discharge: 2015-06-26 | Disposition: A | Discharge status: Home / Self Care | Payer: BLUE CROSS/BLUE SHIELD | Attending: Obstetrics and Gynecology | Admitting: Obstetrics and Gynecology

## 2015-06-26 DIAGNOSIS — Z3A34 34 weeks gestation of pregnancy: Secondary | ICD-10-CM | POA: Diagnosis not present

## 2015-06-26 DIAGNOSIS — A084 Viral intestinal infection, unspecified: Secondary | ICD-10-CM | POA: Insufficient documentation

## 2015-06-26 DIAGNOSIS — O98513 Other viral diseases complicating pregnancy, third trimester: Secondary | ICD-10-CM | POA: Diagnosis not present

## 2015-06-26 DIAGNOSIS — Z91040 Latex allergy status: Secondary | ICD-10-CM | POA: Diagnosis not present

## 2015-06-26 DIAGNOSIS — Z880 Allergy status to penicillin: Secondary | ICD-10-CM | POA: Diagnosis not present

## 2015-06-26 DIAGNOSIS — Z9102 Food additives allergy status: Secondary | ICD-10-CM | POA: Insufficient documentation

## 2015-06-26 DIAGNOSIS — O368130 Decreased fetal movements, third trimester, not applicable or unspecified: Principal | ICD-10-CM | POA: Insufficient documentation

## 2015-06-26 LAB — COMPREHENSIVE METABOLIC PANEL
ALBUMIN: 2.8 g/dL — AB (ref 3.5–5.0)
ALT: 15 U/L (ref 14–54)
AST: 18 U/L (ref 15–41)
Alkaline Phosphatase: 89 U/L (ref 38–126)
Anion gap: 9 (ref 5–15)
BILIRUBIN TOTAL: 0.6 mg/dL (ref 0.3–1.2)
BUN: 6 mg/dL (ref 6–20)
CHLORIDE: 108 mmol/L (ref 101–111)
CO2: 19 mmol/L — ABNORMAL LOW (ref 22–32)
CREATININE: 0.39 mg/dL — AB (ref 0.44–1.00)
Calcium: 8.8 mg/dL — ABNORMAL LOW (ref 8.9–10.3)
GFR calc Af Amer: >60 mL/min (ref >60)
GLUCOSE: 92 mg/dL (ref 65–99)
POTASSIUM: 3.9 mmol/L (ref 3.5–5.1)
Sodium: 136 mmol/L (ref 135–145)
TOTAL PROTEIN: 6.4 g/dL — AB (ref 6.5–8.1)

## 2015-06-26 LAB — URINALYSIS COMPLETE WITH MICROSCOPIC (ARMC ONLY)
BILIRUBIN URINE: NEGATIVE
Glucose, UA: NEGATIVE mg/dL
Hgb urine dipstick: NEGATIVE
KETONES UR: NEGATIVE mg/dL
Nitrite: NEGATIVE
Protein, ur: NEGATIVE mg/dL
Specific Gravity, Urine: 1.014 (ref 1.005–1.030)
pH: 8 (ref 5.0–8.0)

## 2015-06-26 MED ORDER — BISMUTH SUBSALICYLATE 262 MG/15ML PO SUSP
30.0000 mL | ORAL | Status: DC | PRN
  Administered 2015-06-26: 30 mL via ORAL
  Filled 2015-06-26 (×2): qty 118

## 2015-06-26 NOTE — OB Triage Note (Signed)
Ms. Brenda Gutierrez here with c/o diarrhea since Sat. night, nausea and vomiting 3 times yesterday. She states she has had some contractions over the past couple of weeks. Also reports decreased fetal movement, last movement at AM. Denies bleeding and LOF

## 2015-06-26 NOTE — Progress Notes (Signed)
L&D OB Triage Note  HPI:  Lavonna RuaBrandy B Canada is a 30 y.o. G1P0 female at 3674w6d, Estimated Date of Delivery: 08/01/15 who presents for complaints of nausea/vomiting and diarrhea  x 2 days.  Last episode was last night. Denies recent sick contacts, eating anything out of the ordinary. Last episode of Also c/o decreased fetal movement x 1 day (~ 8 hrs).  Denies contractions, LOF, vaginal bleeding, or abdominal pain.    OB History  Gravida Para Term Preterm AB SAB TAB Ectopic Multiple Living  1             # Outcome Date GA Lbr Len/2nd Weight Sex Delivery Anes PTL Lv  1 Current              Past Medical History  Diagnosis Date  . Headache     migraines   No current facility-administered medications on file prior to encounter.   Current Outpatient Prescriptions on File Prior to Encounter  Medication Sig Dispense Refill  . IODINE, KELP, PO Take 225 mg by mouth daily. 1/2 tablet    . Prenatal Vit-Fe Fumarate-FA (MULTIVITAMIN-PRENATAL) 27-0.8 MG TABS tablet Take 1 tablet by mouth daily at 12 noon.    Marland Kitchen. EPINEPHrine 0.3 mg/0.3 mL IJ SOAJ injection Inject into the muscle.      Allergies  Allergen Reactions  . Anael Rosch Flavor [Flavoring Agent] Anaphylaxis  . Insect Extract Anaphylaxis  . Penicillin G Anaphylaxis  . Penicillins Anaphylaxis  . Adhesive [Tape]   . Amoxicillin-Pot Clavulanate Hives  . Latex Rash    ROS:  Review of Systems - Negative except what is noted in HPI.    Physical Exam:  Blood pressure 133/76, pulse 95, temperature 97.8 F (36.6 C), temperature source Oral, resp. rate 20, height 5\' 4"  (1.626 m), weight 259 lb (117.482 kg), last menstrual period 10/25/2014. General appearance: alert and no distress Abdomen: soft, non-tender; bowel sounds normal; no masses,  no organomegaly, gravid.  Pelvic: deferred Extremities: extremities normal, atraumatic, no cyanosis or edema   NST INTERPRETATION: Indications: decreased fetal movement  Mode: External Baseline Rate  (A): 135 bpm Variability: Moderate Accelerations: 15 x 15 Decelerations: None     Contraction Frequency (min): occasional  Impression: reactive   Assessment:  30 y.o. G1P0 at 1874w6d with:  1. Viral gastroenteritis 2. Decreased fetal movement   Plan:  1. Discussed management of gastroenteritis.  Advised on dry (BRAT) diet and adequate hydration over next several days.  Given dose of Pepto-bismol.  Patient able to tolerate juice and crackers while in triage.   2. NST reviewed, reactive today.  Discussed fetal kick counts.  3.  Advised to keep routine OB follow up in clinic.    Hildred LaserAnika Feliciano Wynter, MD [facility]

## 2015-06-26 NOTE — Discharge Instructions (Signed)

## 2015-06-29 ENCOUNTER — Telehealth: Payer: Self-pay | Admitting: Obstetrics and Gynecology

## 2015-06-29 NOTE — Telephone Encounter (Signed)
Ptis [redacted] wks pregnant, was admitted Monday with vomitting and diahrrea. She came home Monday and still sick, diahrrea. Taking ammodium and pepto. Please call to see if there is something else she can do.

## 2015-06-30 NOTE — Telephone Encounter (Signed)
Called pt she states that she "just feels really bad" pt states that she has NOT had any vomiting since Sunday. Last episode of diarrhea was 1am this morning (06/30/2015) pt states that she is using Pepto and immodium. Last dose of immodium was at 3am no diarrhea since. Last urine output was 1hr ago. Pt denies any bleeding or leakage of fluid. Pt states that baby is still moving well. Advised pt that she does not have any sx of dehydration which is good, advised pt to continue with adequate hydration with Gatorade and Pedialyte. Advised pt on BRAT diet. Advised pt that it is certainly well with reason that she would feel bad as she has been dealing with this for several days. Advised pt that is normal for her to feel fatigued and lethargic. Pt to call back if sx persist or worsen.

## 2015-07-06 ENCOUNTER — Encounter: Payer: Self-pay | Admitting: Obstetrics and Gynecology

## 2015-07-06 ENCOUNTER — Ambulatory Visit (INDEPENDENT_AMBULATORY_CARE_PROVIDER_SITE_OTHER): Payer: BLUE CROSS/BLUE SHIELD | Admitting: Obstetrics and Gynecology

## 2015-07-06 VITALS — BP 131/77 | HR 116 | Wt 264.0 lb

## 2015-07-06 DIAGNOSIS — O3660X0 Maternal care for excessive fetal growth, unspecified trimester, not applicable or unspecified: Secondary | ICD-10-CM

## 2015-07-06 DIAGNOSIS — E669 Obesity, unspecified: Secondary | ICD-10-CM

## 2015-07-06 DIAGNOSIS — O368130 Decreased fetal movements, third trimester, not applicable or unspecified: Secondary | ICD-10-CM

## 2015-07-06 DIAGNOSIS — Z3403 Encounter for supervision of normal first pregnancy, third trimester: Secondary | ICD-10-CM

## 2015-07-06 DIAGNOSIS — Z36 Encounter for antenatal screening of mother: Secondary | ICD-10-CM

## 2015-07-06 DIAGNOSIS — O26849 Uterine size-date discrepancy, unspecified trimester: Secondary | ICD-10-CM

## 2015-07-06 DIAGNOSIS — O99210 Obesity complicating pregnancy, unspecified trimester: Secondary | ICD-10-CM

## 2015-07-06 DIAGNOSIS — Z113 Encounter for screening for infections with a predominantly sexual mode of transmission: Secondary | ICD-10-CM

## 2015-07-06 DIAGNOSIS — Z369 Encounter for antenatal screening, unspecified: Secondary | ICD-10-CM

## 2015-07-06 LAB — POCT URINALYSIS DIPSTICK
Bilirubin, UA: NEGATIVE
Glucose, UA: NEGATIVE
Ketones, UA: NEGATIVE
Nitrite, UA: NEGATIVE
PH UA: 6.5
PROTEIN UA: NEGATIVE
RBC UA: NEGATIVE
SPEC GRAV UA: 1.02
UROBILINOGEN UA: NEGATIVE

## 2015-07-06 LAB — OB RESULTS CONSOLE GBS: STREP GROUP B AG: NEGATIVE

## 2015-07-06 NOTE — Progress Notes (Signed)
NONSTRESS TEST INTERPRETATION  INDICATIONS: Decreased fetal movement, obesity  FHR baseline: 135 RESULTS: reactive COMMENTS: 1 variable deceleration noted, lasting 30 secs, from baseline to 100 bpm.  Uterine irritability noted.    PLAN: 1. Continue fetal kick counts twice a day. 2. Continue antepartum testing as scheduled weekly   Fenton Mallingebbie Ellanora Rayborn, LPN

## 2015-07-06 NOTE — Progress Notes (Signed)
NST performed today was reviewed and was found to be reactive. 1 spontaneous variable deceleration with return to baseline. Good variability noted throughout. Continue recommended antenatal testing and prenatal care.

## 2015-07-06 NOTE — Progress Notes (Signed)
ROB: Patient doing well, denies complaints.  36 week labs today.  Discussed no further weight gain in pregnancy.  Size greater than dates, will order growth scan.  For NST today, for decreased fetal movement. Will perform weekly NSTs until delivery.

## 2015-07-08 LAB — GC/CHLAMYDIA PROBE AMP
Chlamydia trachomatis, NAA: NEGATIVE
Neisseria gonorrhoeae by PCR: NEGATIVE

## 2015-07-08 LAB — STREP GP B NAA+RFLX: STREP GP B NAA+RFLX: NEGATIVE

## 2015-07-11 ENCOUNTER — Encounter: Payer: Self-pay | Admitting: *Deleted

## 2015-07-11 ENCOUNTER — Observation Stay
Admission: EM | Admit: 2015-07-11 | Discharge: 2015-07-11 | Disposition: A | Discharge status: Home / Self Care | Payer: BLUE CROSS/BLUE SHIELD | Attending: Obstetrics and Gynecology | Admitting: Obstetrics and Gynecology

## 2015-07-11 ENCOUNTER — Telehealth: Payer: Self-pay | Admitting: Obstetrics and Gynecology

## 2015-07-11 DIAGNOSIS — E860 Dehydration: Secondary | ICD-10-CM | POA: Insufficient documentation

## 2015-07-11 DIAGNOSIS — O99613 Diseases of the digestive system complicating pregnancy, third trimester: Secondary | ICD-10-CM | POA: Diagnosis not present

## 2015-07-11 DIAGNOSIS — O9989 Other specified diseases and conditions complicating pregnancy, childbirth and the puerperium: Secondary | ICD-10-CM | POA: Diagnosis not present

## 2015-07-11 DIAGNOSIS — Z3A37 37 weeks gestation of pregnancy: Secondary | ICD-10-CM | POA: Diagnosis not present

## 2015-07-11 DIAGNOSIS — R197 Diarrhea, unspecified: Secondary | ICD-10-CM | POA: Insufficient documentation

## 2015-07-11 DIAGNOSIS — R824 Acetonuria: Secondary | ICD-10-CM | POA: Diagnosis not present

## 2015-07-11 DIAGNOSIS — O471 False labor at or after 37 completed weeks of gestation: Principal | ICD-10-CM | POA: Insufficient documentation

## 2015-07-11 LAB — URINALYSIS COMPLETE WITH MICROSCOPIC (ARMC ONLY)
BILIRUBIN URINE: NEGATIVE
Bacteria, UA: NONE SEEN
GLUCOSE, UA: NEGATIVE mg/dL
HGB URINE DIPSTICK: NEGATIVE
NITRITE: NEGATIVE
PH: 5 (ref 5.0–8.0)
Protein, ur: NEGATIVE mg/dL
SPECIFIC GRAVITY, URINE: 1.023 (ref 1.005–1.030)

## 2015-07-11 MED ORDER — LACTATED RINGERS IV SOLN
Freq: Once | INTRAVENOUS | Status: AC
  Administered 2015-07-11: 02:00:00 via INTRAVENOUS

## 2015-07-11 NOTE — Telephone Encounter (Signed)
Brenda BienenstockBrandy was admitted to hospital last night w/ contracrtions. She wasn't dilating so they sent her home. She is still contracting every 5-6 mins apart. She wants you to call her. She didn't know if you would want her to come in

## 2015-07-11 NOTE — Telephone Encounter (Signed)
Called pt she states that she was seen in L&D last night for contractions, was told that she was not in labor and was dehydrated. Was given fluids. Pt states that today she is still contracting every 5-6 mins as she was last night. Pt states that contractions are about the same and have not increased in intensity. Pt denies bleeding, leaking fluid. Per Dr.Cherry advised pt that she only needs to be reevaluated if she is bleeding, leaking fluid, or contractions increase in frequency and or intensity. Advised pt that contractions can begin several weeks to several days before true labor begins.

## 2015-07-12 ENCOUNTER — Ambulatory Visit (INDEPENDENT_AMBULATORY_CARE_PROVIDER_SITE_OTHER): Payer: BLUE CROSS/BLUE SHIELD | Admitting: Obstetrics and Gynecology

## 2015-07-12 ENCOUNTER — Encounter: Payer: Self-pay | Admitting: Obstetrics and Gynecology

## 2015-07-12 ENCOUNTER — Other Ambulatory Visit: Payer: BLUE CROSS/BLUE SHIELD

## 2015-07-12 ENCOUNTER — Ambulatory Visit (INDEPENDENT_AMBULATORY_CARE_PROVIDER_SITE_OTHER): Payer: BLUE CROSS/BLUE SHIELD

## 2015-07-12 VITALS — BP 125/87 | HR 102 | Wt 261.6 lb

## 2015-07-12 DIAGNOSIS — O26849 Uterine size-date discrepancy, unspecified trimester: Secondary | ICD-10-CM

## 2015-07-12 DIAGNOSIS — Z3403 Encounter for supervision of normal first pregnancy, third trimester: Secondary | ICD-10-CM

## 2015-07-12 DIAGNOSIS — O99210 Obesity complicating pregnancy, unspecified trimester: Secondary | ICD-10-CM

## 2015-07-12 DIAGNOSIS — O409XX0 Polyhydramnios, unspecified trimester, not applicable or unspecified: Secondary | ICD-10-CM | POA: Insufficient documentation

## 2015-07-12 DIAGNOSIS — O289 Unspecified abnormal findings on antenatal screening of mother: Secondary | ICD-10-CM

## 2015-07-12 DIAGNOSIS — O3660X0 Maternal care for excessive fetal growth, unspecified trimester, not applicable or unspecified: Secondary | ICD-10-CM

## 2015-07-12 DIAGNOSIS — O368130 Decreased fetal movements, third trimester, not applicable or unspecified: Secondary | ICD-10-CM

## 2015-07-12 NOTE — Progress Notes (Signed)
ROB: Patient denies complaints today.  Was seen in triage yesterday morning for contractions, ruled out for labor, noted to be mildly dehydrated (2+ ketonuria) due to recent bout of diarrhea.  Growth scan performed today for size>dates, EFW 70%ile (3412 grams).  AFI elevated at 22.6 cm.  NST performed today was reviewed and was found to be reactive, 1 variable deceleration noted with good return to baseline.  > 10 movements noted during 30 minute tracing. Continue recommended antenatal testing and prenatal care and weekly NSTs.  Continue to advise on fetal kick counts (decreased FM likely due to patient's body habitus and weight gain). 36 week labs negative. RTC in 1 week.  Discussed labor precautions.    NONSTRESS TEST INTERPRETATION  INDICATIONS: Decreased fetal movement, obesity  FHR baseline: 145 RESULTS: reactive COMMENTS: 1 variable deceleration noted, lasting 30 secs, from baseline to 120 bpm.  Irregular contractions present.    PLAN: 1. Continue fetal kick counts twice a day. 2. Continue antepartum testing as scheduled weekly

## 2015-07-12 NOTE — Final Progress Note (Signed)
L&D OB Triage Note  Lavonna RuaBrandy B Dowler is a 30 y.o. G1P0 female at 2152w0d, EDD Estimated Date of Delivery: 08/01/15 who presented to triage for complaints of irregular contractions q 5-7 min.  Intensity was rated a 5 out of 10.  Also notes having several episodes of diarrhea in the days prior.  She was evaluated by the nurses with findings significant for mild dehydration as evident by ketonuria (2+).  She was ruled out for labor.  Vital signs stable. An NST was performed and has been reviewed by MD. She was treated with IVF bolus.    Physical Exam: Pulse 84, temperature 97.8 F (36.6 C), temperature source Oral, resp. rate 16, height 5\' 4"  (1.626 m), weight 262 lb (118.842 kg), last menstrual period 10/25/2014. Gen App: NAD Cervix: closed/thick per nurse exam (unchanged after 2 hours)  NST INTERPRETATION: Indications: rule out uterine contractions  Mode: External Baseline Rate (A): 135 bpm Variability: Moderate Accelerations: 15 x 15 Decelerations: Variable     Contraction Frequency (min): 1-4  Impression: reactive   Plan: NST performed was reviewed and was found to be reactive. She was discharged home with bleeding/labor precautions.  Advised to continue PO hydration at home.  Can take Kaopectate/Pepto-Bismol for diarrhea.  Continue routine prenatal care. Follow up with OB/GYN as previously scheduled.     Hildred LaserAnika Emanuell Morina, MD [facility]

## 2015-07-14 ENCOUNTER — Ambulatory Visit (INDEPENDENT_AMBULATORY_CARE_PROVIDER_SITE_OTHER): Payer: BLUE CROSS/BLUE SHIELD | Admitting: Obstetrics and Gynecology

## 2015-07-14 ENCOUNTER — Encounter: Payer: Self-pay | Admitting: Obstetrics and Gynecology

## 2015-07-14 VITALS — BP 108/71 | HR 109 | Wt 264.6 lb

## 2015-07-14 DIAGNOSIS — O479 False labor, unspecified: Secondary | ICD-10-CM

## 2015-07-14 DIAGNOSIS — O99210 Obesity complicating pregnancy, unspecified trimester: Secondary | ICD-10-CM

## 2015-07-14 DIAGNOSIS — Z3403 Encounter for supervision of normal first pregnancy, third trimester: Secondary | ICD-10-CM | POA: Diagnosis not present

## 2015-07-14 LAB — POCT URINALYSIS DIPSTICK
BILIRUBIN UA: NEGATIVE
GLUCOSE UA: NEGATIVE
KETONES UA: 5
Nitrite, UA: NEGATIVE
PH UA: 6.5
Spec Grav, UA: 1.015
Urobilinogen, UA: NEGATIVE

## 2015-07-14 NOTE — Progress Notes (Signed)
Problem OB: Patient presents with complaints of contractions increasing in intensity, and frequency (now q 8-9 min).  Cervix with minimal change.  Labor precautions given.  To f/u next week for regularly scheduled OB visit.

## 2015-07-18 ENCOUNTER — Encounter: Payer: BLUE CROSS/BLUE SHIELD | Admitting: Obstetrics and Gynecology

## 2015-07-18 ENCOUNTER — Ambulatory Visit (INDEPENDENT_AMBULATORY_CARE_PROVIDER_SITE_OTHER): Payer: BLUE CROSS/BLUE SHIELD | Admitting: Obstetrics and Gynecology

## 2015-07-18 ENCOUNTER — Encounter: Payer: Self-pay | Admitting: Obstetrics and Gynecology

## 2015-07-18 VITALS — BP 123/78 | HR 94 | Wt 266.6 lb

## 2015-07-18 DIAGNOSIS — Z3493 Encounter for supervision of normal pregnancy, unspecified, third trimester: Secondary | ICD-10-CM

## 2015-07-18 DIAGNOSIS — O289 Unspecified abnormal findings on antenatal screening of mother: Secondary | ICD-10-CM

## 2015-07-18 DIAGNOSIS — R197 Diarrhea, unspecified: Secondary | ICD-10-CM

## 2015-07-18 DIAGNOSIS — O368130 Decreased fetal movements, third trimester, not applicable or unspecified: Secondary | ICD-10-CM

## 2015-07-18 DIAGNOSIS — A09 Infectious gastroenteritis and colitis, unspecified: Secondary | ICD-10-CM

## 2015-07-18 DIAGNOSIS — Z1389 Encounter for screening for other disorder: Secondary | ICD-10-CM

## 2015-07-18 DIAGNOSIS — O409XX0 Polyhydramnios, unspecified trimester, not applicable or unspecified: Secondary | ICD-10-CM

## 2015-07-18 DIAGNOSIS — O99210 Obesity complicating pregnancy, unspecified trimester: Secondary | ICD-10-CM

## 2015-07-18 DIAGNOSIS — Z3483 Encounter for supervision of other normal pregnancy, third trimester: Secondary | ICD-10-CM

## 2015-07-18 LAB — POCT URINALYSIS DIPSTICK
Bilirubin, UA: NEGATIVE
Blood, UA: NEGATIVE
GLUCOSE UA: NEGATIVE
KETONES UA: NEGATIVE
Nitrite, UA: NEGATIVE
Protein, UA: NEGATIVE
SPEC GRAV UA: 1.025
Urobilinogen, UA: NEGATIVE
pH, UA: 6

## 2015-07-18 MED ORDER — ACETAMINOPHEN-CODEINE #3 300-30 MG PO TABS: 1.0000 | ORAL_TABLET | Freq: Four times a day (QID) | ORAL | Status: DC | PRN

## 2015-07-18 NOTE — Progress Notes (Signed)
ROB: Patient complains of persistent diarrhea despite use of Imodium, Kaopectate, and Pepto-Bismol (has been ongoing for almost 2 weeks).  Now also noting difficulty keeping certain foods down. Denies sick contacts, recent changes in dietary habits.  Is adhering to a BRAT diet and trying to remain hydrated.  Will order stool cultures. In addition, notes that contraction intensity waxes and wanes, but looses sleep due to the pain.  Reports that contractions can last from 3-4 hrs to ~ 16 hrs of the day. Also continues to note decreased fetal movement (although audible fetal movement noted during NST).  Likely due to body habitus. Will order BPP to assess fetal well being and also f/u increased AFI noted on last ultrasound.  NST performed today was reviewed and was found to be reactive.  Continue recommended antenatal testing and prenatal care.

## 2015-07-18 NOTE — Progress Notes (Signed)
NONSTRESS TEST INTERPRETATION  INDICATIONS: Decreased fetal movement; Obesity  FHR baseline: 150 RESULTS: reactive COMMENTS:   PLAN: 1. Continue fetal kick counts twice a day. 2. Continue antepartum testing as scheduled-Biweekly   Fenton Mallingebbie Amsi Grimley, LPN

## 2015-07-19 ENCOUNTER — Encounter: Payer: BLUE CROSS/BLUE SHIELD | Admitting: Obstetrics and Gynecology

## 2015-07-19 ENCOUNTER — Telehealth: Payer: Self-pay | Admitting: Obstetrics and Gynecology

## 2015-07-19 NOTE — Telephone Encounter (Signed)
Called pt informed her that rx is at the pharmacy was sent in yesterday evening. Advised pt to bring stool sample asap, continue to push fluids.

## 2015-07-19 NOTE — Telephone Encounter (Signed)
Patient called questioning prescriptions. She thought Dr Valentino Saxonherry was going to send in something for her digestive issues.

## 2015-07-21 ENCOUNTER — Inpatient Hospital Stay: Payer: BLUE CROSS/BLUE SHIELD | Admitting: Registered Nurse

## 2015-07-21 ENCOUNTER — Encounter: Payer: Self-pay | Admitting: *Deleted

## 2015-07-21 ENCOUNTER — Inpatient Hospital Stay
Admission: EM | Admit: 2015-07-21 | Discharge: 2015-07-23 | DRG: 766 | Disposition: A | Discharge status: Home / Self Care | Payer: BLUE CROSS/BLUE SHIELD | Attending: Obstetrics and Gynecology | Admitting: Obstetrics and Gynecology

## 2015-07-21 ENCOUNTER — Telehealth: Payer: Self-pay

## 2015-07-21 ENCOUNTER — Encounter
Admission: EM | Disposition: A | Discharge status: Home / Self Care | Payer: Self-pay | Source: Home / Self Care | Attending: Obstetrics and Gynecology

## 2015-07-21 DIAGNOSIS — IMO0002 Reserved for concepts with insufficient information to code with codable children: Secondary | ICD-10-CM | POA: Diagnosis present

## 2015-07-21 DIAGNOSIS — Z3403 Encounter for supervision of normal first pregnancy, third trimester: Secondary | ICD-10-CM

## 2015-07-21 DIAGNOSIS — O99210 Obesity complicating pregnancy, unspecified trimester: Secondary | ICD-10-CM | POA: Diagnosis present

## 2015-07-21 DIAGNOSIS — O99214 Obesity complicating childbirth: Secondary | ICD-10-CM | POA: Diagnosis present

## 2015-07-21 DIAGNOSIS — Z23 Encounter for immunization: Secondary | ICD-10-CM

## 2015-07-21 DIAGNOSIS — Z3A38 38 weeks gestation of pregnancy: Secondary | ICD-10-CM | POA: Diagnosis not present

## 2015-07-21 DIAGNOSIS — O409XX0 Polyhydramnios, unspecified trimester, not applicable or unspecified: Secondary | ICD-10-CM | POA: Diagnosis present

## 2015-07-21 DIAGNOSIS — O9989 Other specified diseases and conditions complicating pregnancy, childbirth and the puerperium: Secondary | ICD-10-CM

## 2015-07-21 DIAGNOSIS — Z283 Underimmunization status: Secondary | ICD-10-CM

## 2015-07-21 DIAGNOSIS — Z2839 Other underimmunization status: Secondary | ICD-10-CM

## 2015-07-21 DIAGNOSIS — O289 Unspecified abnormal findings on antenatal screening of mother: Secondary | ICD-10-CM | POA: Diagnosis present

## 2015-07-21 LAB — ABO/RH: ABO/RH(D): A POS

## 2015-07-21 LAB — CBC
HCT: 39.7 % (ref 35.0–47.0)
Hemoglobin: 13 g/dL (ref 12.0–16.0)
MCH: 27.5 pg (ref 26.0–34.0)
MCHC: 32.7 g/dL (ref 32.0–36.0)
MCV: 84 fL (ref 80.0–100.0)
PLATELETS: 332 10*3/uL (ref 150–440)
RBC: 4.73 MIL/uL (ref 3.80–5.20)
RDW: 14.5 % (ref 11.5–14.5)
WBC: 17.3 10*3/uL — ABNORMAL HIGH (ref 3.6–11.0)

## 2015-07-21 LAB — RAPID HIV SCREEN (HIV 1/2 AB+AG)
HIV 1/2 Antibodies: NONREACTIVE
HIV-1 P24 ANTIGEN - HIV24: NONREACTIVE

## 2015-07-21 LAB — TYPE AND SCREEN
ABO/RH(D): A POS
ANTIBODY SCREEN: NEGATIVE

## 2015-07-21 SURGERY — Surgical Case
Anesthesia: Spinal | Site: Abdomen | Wound class: Clean Contaminated

## 2015-07-21 MED ORDER — OXYTOCIN BOLUS FROM INFUSION: 500.0000 mL | INTRAVENOUS | Status: DC

## 2015-07-21 MED ORDER — ACETAMINOPHEN 325 MG PO TABS: 650.0000 mg | ORAL_TABLET | ORAL | Status: DC | PRN

## 2015-07-21 MED ORDER — OXYTOCIN 40 UNITS IN LACTATED RINGERS INFUSION - SIMPLE MED
INTRAVENOUS | Status: AC
  Filled 2015-07-21: qty 1000

## 2015-07-21 MED ORDER — FENTANYL CITRATE (PF) 100 MCG/2ML IJ SOLN: 25.0000 ug | INTRAMUSCULAR | Status: DC | PRN

## 2015-07-21 MED ORDER — LACTATED RINGERS IV SOLN
INTRAVENOUS | Status: DC
  Administered 2015-07-21: 16:00:00 via INTRAVENOUS

## 2015-07-21 MED ORDER — ONDANSETRON HCL 4 MG/2ML IJ SOLN: 4.0000 mg | Freq: Once | INTRAMUSCULAR | Status: DC | PRN

## 2015-07-21 MED ORDER — LIDOCAINE 5 % EX PTCH
MEDICATED_PATCH | CUTANEOUS | Status: DC | PRN
  Administered 2015-07-21: 1 via TRANSDERMAL

## 2015-07-21 MED ORDER — LIDOCAINE HCL (PF) 1 % IJ SOLN: 30.0000 mL | INTRAMUSCULAR | Status: DC | PRN

## 2015-07-21 MED ORDER — LACTATED RINGERS IV SOLN: 500.0000 mL | INTRAVENOUS | Status: DC | PRN

## 2015-07-21 MED ORDER — TERBUTALINE SULFATE 1 MG/ML IJ SOLN: 0.2500 mg | Freq: Once | INTRAMUSCULAR | Status: DC | PRN

## 2015-07-21 MED ORDER — DINOPROSTONE 10 MG VA INST
10.0000 mg | VAGINAL_INSERT | Freq: Once | VAGINAL | Status: AC
  Administered 2015-07-21: 10 mg via VAGINAL
  Filled 2015-07-21: qty 1

## 2015-07-21 MED ORDER — GENTAMICIN SULFATE 40 MG/ML IJ SOLN
5.0000 mg/kg | INTRAVENOUS | Status: DC
  Filled 2015-07-21: qty 15

## 2015-07-21 MED ORDER — MORPHINE SULFATE (PF) 0.5 MG/ML IJ SOLN
INTRAMUSCULAR | Status: DC | PRN
  Administered 2015-07-21: .2 mg via EPIDURAL

## 2015-07-21 MED ORDER — ONDANSETRON HCL 4 MG/2ML IJ SOLN: 4.0000 mg | Freq: Four times a day (QID) | INTRAMUSCULAR | Status: DC | PRN

## 2015-07-21 MED ORDER — LIDOCAINE 5 % EX PTCH
1.0000 | MEDICATED_PATCH | CUTANEOUS | Status: DC
  Filled 2015-07-21: qty 1

## 2015-07-21 MED ORDER — OXYTOCIN 40 UNITS IN LACTATED RINGERS INFUSION - SIMPLE MED
2.5000 [IU]/h | INTRAVENOUS | Status: DC
  Administered 2015-07-21: 500 mL via INTRAVENOUS
  Filled 2015-07-21: qty 1000

## 2015-07-21 MED ORDER — BUPIVACAINE IN DEXTROSE 0.75-8.25 % IT SOLN
INTRATHECAL | Status: DC | PRN
  Administered 2015-07-21: 1.6 mL via INTRATHECAL

## 2015-07-21 MED ORDER — CLINDAMYCIN PHOSPHATE 900 MG/50ML IV SOLN
900.0000 mg | INTRAVENOUS | Status: DC
  Filled 2015-07-21: qty 50

## 2015-07-21 MED ORDER — MEPERIDINE HCL 25 MG/ML IJ SOLN: 6.2500 mg | INTRAMUSCULAR | Status: DC | PRN

## 2015-07-21 MED ORDER — BUTORPHANOL TARTRATE 1 MG/ML IJ SOLN: 2.0000 mg | INTRAMUSCULAR | Status: DC | PRN

## 2015-07-21 MED ORDER — CITRIC ACID-SODIUM CITRATE 334-500 MG/5ML PO SOLN
30.0000 mL | ORAL | Status: DC | PRN
  Administered 2015-07-21: 30 mL via ORAL
  Filled 2015-07-21: qty 30

## 2015-07-21 MED ORDER — PHENYLEPHRINE HCL 10 MG/ML IJ SOLN
INTRAMUSCULAR | Status: DC | PRN
  Administered 2015-07-21 (×3): 100 ug via INTRAVENOUS

## 2015-07-21 SURGICAL SUPPLY — 29 items
BAG COUNTER SPONGE EZ (MISCELLANEOUS) ×2 IMPLANT
CANISTER SUCT 3000ML (MISCELLANEOUS) ×3 IMPLANT
CHLORAPREP W/TINT 26ML (MISCELLANEOUS) ×6 IMPLANT
CLOSURE WOUND 1/2 X4 (GAUZE/BANDAGES/DRESSINGS) ×2
COUNTER SPONGE BAG EZ (MISCELLANEOUS) ×1
DRSG OPSITE POSTOP 4X10 (GAUZE/BANDAGES/DRESSINGS) ×3 IMPLANT
DRSG TELFA 3X8 NADH (GAUZE/BANDAGES/DRESSINGS) ×3 IMPLANT
ELECT REM PT RETURN 9FT ADLT (ELECTROSURGICAL) ×3
ELECTRODE REM PT RTRN 9FT ADLT (ELECTROSURGICAL) ×1 IMPLANT
GAUZE SPONGE 4X4 12PLY STRL (GAUZE/BANDAGES/DRESSINGS) ×3 IMPLANT
GLOVE BIO SURGEON STRL SZ 6 (GLOVE) ×18 IMPLANT
GLOVE BIOGEL PI IND STRL 6.5 (GLOVE) ×3 IMPLANT
GLOVE BIOGEL PI INDICATOR 6.5 (GLOVE) ×6
GOWN STRL REUS W/ TWL LRG LVL3 (GOWN DISPOSABLE) ×3 IMPLANT
GOWN STRL REUS W/TWL LRG LVL3 (GOWN DISPOSABLE) ×6
KIT RM TURNOVER STRD PROC AR (KITS) ×3 IMPLANT
NS IRRIG 1000ML POUR BTL (IV SOLUTION) ×3 IMPLANT
PACK C SECTION AR (MISCELLANEOUS) ×3 IMPLANT
PAD OB MATERNITY 4.3X12.25 (PERSONAL CARE ITEMS) ×3 IMPLANT
PAD PREP 24X41 OB/GYN DISP (PERSONAL CARE ITEMS) ×3 IMPLANT
RETRACTOR TRAXI PANNICULUS (MISCELLANEOUS) ×1 IMPLANT
STRIP CLOSURE SKIN 1/2X4 (GAUZE/BANDAGES/DRESSINGS) ×4 IMPLANT
SUT MNCRL AB 4-0 PS2 18 (SUTURE) ×3 IMPLANT
SUT PLAIN 2 0 XLH (SUTURE) IMPLANT
SUT VIC AB 0 CT1 36 (SUTURE) ×12 IMPLANT
SUT VIC AB 3-0 SH 27 (SUTURE) ×2
SUT VIC AB 3-0 SH 27X BRD (SUTURE) ×1 IMPLANT
SWABSTK COMLB BENZOIN TINCTURE (MISCELLANEOUS) ×3 IMPLANT
TRAXI PANNICULUS RETRACTOR (MISCELLANEOUS) ×2

## 2015-07-21 NOTE — Anesthesia Preprocedure Evaluation (Signed)
Anesthesia Evaluation  Patient identified by MRN, date of birth, ID band Patient awake    Reviewed: Allergy & Precautions, NPO status , Patient's Chart, lab work & pertinent test results  History of Anesthesia Complications Negative for: history of anesthetic complications  Airway Mallampati: III       Dental   Pulmonary neg pulmonary ROS, former smoker,           Cardiovascular negative cardio ROS       Neuro/Psych negative neurological ROS     GI/Hepatic Neg liver ROS, GERD  ,  Endo/Other  negative endocrine ROS  Renal/GU negative Renal ROS     Musculoskeletal   Abdominal   Peds  Hematology negative hematology ROS (+)   Anesthesia Other Findings   Reproductive/Obstetrics                             Anesthesia Physical Anesthesia Plan  ASA: II and emergent  Anesthesia Plan: Spinal   Post-op Pain Management:    Induction:   Airway Management Planned:   Additional Equipment:   Intra-op Plan:   Post-operative Plan:   Informed Consent: I have reviewed the patients History and Physical, chart, labs and discussed the procedure including the risks, benefits and alternatives for the proposed anesthesia with the patient or authorized representative who has indicated his/her understanding and acceptance.     Plan Discussed with:   Anesthesia Plan Comments:         Anesthesia Quick Evaluation

## 2015-07-21 NOTE — Anesthesia Procedure Notes (Addendum)
Date/Time: 07/21/2015 8:34 PM Performed by: Doreen Salvage Pre-anesthesia Checklist: Patient identified, Emergency Drugs available, Suction available and Patient being monitored Patient Re-evaluated:Patient Re-evaluated prior to inductionOxygen Delivery Method: Nasal cannula Dental Injury: Teeth and Oropharynx as per pre-operative assessment  Comments: Nasal cannula with etCO2 monitoring   Spinal Patient location during procedure: OR Start time: 07/21/2015 8:38 PM End time: 07/21/2015 8:43 PM Staffing Resident/CRNA: Doreen Salvage Performed by: resident/CRNA  Preanesthetic Checklist Completed: patient identified, site marked, surgical consent, pre-op evaluation, timeout performed, IV checked, risks and benefits discussed and monitors and equipment checked Spinal Block Patient position: sitting Prep: Betadine Patient monitoring: heart rate, continuous pulse ox, blood pressure and cardiac monitor Approach: midline Location: L4-5 Injection technique: single-shot Needle Needle type: Whitacre and Introducer  Needle gauge: 24 G Needle length: 9 cm Additional Notes Negative paresthesia. Negative blood return. Positive free-flowing CSF. Expiration date of kit checked and confirmed. Patient tolerated procedure well, without complications.

## 2015-07-21 NOTE — Op Note (Signed)
Cesarean Section Procedure Note  Indications: non-reassuring fetal status  Pre-operative Diagnosis: 38 week 4 day pregnancy, non-reassuring fetal tracing, obesity (BMI 45), increased AFI.  Post-operative Diagnosis: Same  Surgeon: Hildred LaserAnika Derric Dealmeida, MD  Assistants: Sharon SellerMartin DeFrancesco, MD  Procedure: Primary low transverse Cesarean Section  Anesthesia: Spinal anesthesia  ASA Class: II  Procedure Details: The patient was seen in the Holding Room. The risks, benefits, complications, treatment options, and expected outcomes were discussed with the patient.  The patient concurred with the proposed plan, giving informed consent.  The site of surgery properly noted/marked. The patient was taken to the Operating Room, identified as Lavonna RuaBrandy B Corlett and the procedure verified as C-Section Delivery. A Time Out was held and the above information confirmed.  After induction of anesthesia, the patient was draped and prepped in the usual sterile manner. Anesthesia was tested and noted to be adequate. A Pfannenstiel incision was made and carried down through the subcutaneous tissue to the fascia. Fascial incision was made and extended transversely. The fascia was separated from the underlying rectus tissue superiorly and inferiorly. The peritoneum was identified and entered. Peritoneal incision was extended longitudinally. The utero-vesical peritoneal reflection was incised transversely and the bladder flap was bluntly freed from the lower uterine segment. A low transverse uterine incision was made. Delivered from cephalic presentation was a 3310 gram Female with Apgar scores of 8 at one minute and 9 at five minutes.  After the umbilical cord was clamped and cut cord blood was obtained for evaluation. The placenta was removed intact and appeared normal. The uterus was exteriorized and cleared of all clots and debris. The uterine outline, tubes and ovaries appeared normal.  The uterine incision was closed with  running locked sutures of 0-Vicryl.  A second suture of 0-Vicryl was used in an imbricating layer.  Hemostasis was observed. Lavage was carried out until clear. The fascia was then reapproximated with a running suture of 1-0 Vicryl. The subcutaneous fat layer was reapproximated with 3-0 Vicryl. The skin was reapproximated with 4-0 Monocryl.  Instrument, sponge, and needle counts were correct prior the abdominal closure and at the conclusion of the case.   Findings: Female infant, cephalic presentation, 3310 grams, with Apgar scores of 8 at one minute and 9 at five minutes. Intact placenta with 3 vessel cord.  Copious clear amniotic fluid at rupture The uterine outline, tubes and ovaries appeared normal.   Estimated Blood Loss:  600 ml      Drains: foley catheter to gravity drainage, 100 clear urine at end of the procedure         Total IV Fluids: 1200 ml  Specimens: Placenta and Disposition:  Sent to Pathology         Implants: None         Complications:  None; patient tolerated the procedure well.         Disposition: PACU - hemodynamically stable.         Condition: stable   Hildred LaserAnika Hakop Humbarger, MD [facility]

## 2015-07-21 NOTE — Progress Notes (Deleted)
Obstetric History and Physical  Brenda Gutierrez is a 30 y.o. G1P0 with IUP at [redacted]w[redacted]d who presented initially with complaints of leaking fluid.  Her nitrazine test and fern test were negative for ruptured membranes.  However, patient was noted to have a spontaneous deceleration, lasting x 1 minute.  She was kept on continuous monitoring, where an additional 2 spontaneous decelerations were noted. . Patient states she has been having  irregular, every 2-6 minutes contractions, intermittently getting stronger. No vaginal bleeding, with decreased  fetal movement (although patient with increased body habitus, has had difficulty feeling baby move over past several weeks).    Prenatal Course Source of Care: [facility] with onset of care at 12 weeks Pregnancy complications or risks: Patient Active Problem List   Diagnosis Date Noted  . AFI (amniotic fluid index) increased 07/12/2015  . Decreased fetal movement in pregnancy in third trimester, antepartum 07/12/2015  . Fetal size inconsistent with dates 07/06/2015  . Labor and delivery, indication for care 06/26/2015  . Supervision of normal first pregnancy in third trimester 02/14/2015  . Obesity in pregnancy 01/20/2015  . Common migraine with intractable migraine 01/04/2014   She plans to breastfeed She desires oral contraceptives (estrogen/progesterone) for postpartum contraception.   Prenatal labs and studies: ABO, Rh: A/Positive/-- (09/15 1030) Antibody: Negative (09/15 1030) Rubella: <0.90 (09/15 1030) RPR: Non Reactive (09/15 1030)  HBsAg: Negative (09/15 1030)  HIV: Non Reactive (09/15 1030)  GBS: Negative  (03/16  1429) 1 hr Glucola  normal Genetic screening normal Anatomy US normal   Past Medical History  Diagnosis Date  . Headache     migraines    Past Surgical History  Procedure Laterality Date  . Tonsillectomy    . 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 Tumor removal  2005    right breast-benign  .  Ovarian cyst removal Left     OB History  Gravida Para Term Preterm AB SAB TAB Ectopic Multiple Living  1             # Outcome Date GA Lbr Len/2nd Weight Sex Delivery Anes PTL Lv  1 Current               Social History   Social History  . Marital Status: Married    Spouse Name: N/A  . Number of Children: N/A  . Years of Education: N/A   Occupational History  . fitness Psychologist, clinical   Social History Main Topics  . Smoking status: Former Games developer  . Smokeless tobacco: Never Used  . Alcohol Use: No     Comment: social  . Drug Use: No  . Sexual Activity:    Partners: Male   Other Topics Concern  . None   Social History Narrative    Family History  Problem Relation Age of Onset  . Hypertension Paternal Grandfather   . Hyperlipidemia Paternal Grandfather   . Hypertension Paternal Grandmother   . Hyperlipidemia Paternal Grandmother   . Depression Father   . Hypertension Father   . Diabetes Father   . Bipolar disorder Father   . Hyperlipidemia Father   . Diabetes Mother   . Depression Mother   . Anxiety disorder Mother   . Breast cancer Paternal Aunt     Prescriptions prior to admission  Medication Sig Dispense Refill Last Dose  . acetaminophen-codeine (TYLENOL #3) 300-30 MG tablet Take 1-2 tablets by mouth every 6 (six) hours as needed  for moderate pain. 30 tablet 1   . cetirizine (ZYRTEC) 5 MG tablet Take 5 mg by mouth daily.   Taking  . EPINEPHrine 0.3 mg/0.3 mL IJ SOAJ injection Inject into the muscle.   Taking  . IODINE, KELP, PO Take 225 mg by mouth daily. 1/2 tablet   Taking  . Prenatal Vit-Fe Fumarate-FA (MULTIVITAMIN-PRENATAL) 27-0.8 MG TABS tablet Take 1 tablet by mouth daily at 12 noon.   Taking    Allergies  Allergen Reactions  . [place]en [place] Flavor [Flavoring Agent] Anaphylaxis  . Insect Extract Anaphylaxis  . Penicillin G Anaphylaxis  . Penicillins Anaphylaxis  . Adhesive [Tape]   . Amoxicillin-Pot Clavulanate Hives  . Latex Rash     Review of Systems: Negative except for what is mentioned in HPI.  Physical Exam: BP 119/64 mmHg  Pulse 102  Temp(Src) 97.8 F (36.6 C) (Oral)  Ht 5\' 4"  (1.626 m)  Wt 266 lb (120.657 kg)  BMI 45.64 kg/m2  LMP 10/25/2014 (Exact Date) CONSTITUTIONAL: Well-developed, well-nourished female in no acute distress.  HENT:  Normocephalic, atraumatic, External right and left ear normal. Oropharynx is clear and moist EYES: Conjunctivae and EOM are normal. Pupils are equal, round, and reactive to light. No scleral icterus.  NECK: Normal range of motion, supple, no masses SKIN: Skin is warm and dry. No rash noted. Not diaphoretic. No erythema. No pallor. NEUROLOGIC: Alert and oriented to person, place, and time. Normal reflexes, muscle tone coordination. No cranial nerve deficit noted. PSYCHIATRIC: Normal mood and affect. Normal behavior. Normal judgment and thought content. CARDIOVASCULAR: Normal heart rate noted, regular rhythm RESPIRATORY: Effort and breath sounds normal, no problems with respiration noted ABDOMEN: Soft, nontender, nondistended, gravid. MUSCULOSKELETAL: Normal range of motion. No edema and no tenderness. 2+ distal pulses.  Cervical Exam: Dilatation  0.5 cm   Effacement 30 %   Station -3 Presentation: cephalic FHT:  Baseline rate 140 bpm   Variability moderate  Accelerations present   Decelerations variable with occasional late deceleration Contractions: Every 1-2 mins   Pertinent Labs/Studies:   Results for orders placed or performed during the hospital encounter of 07/21/15 (from the past 24 hour(s))  CBC     Status: Abnormal   Collection Time: 07/21/15  3:29 PM  Result Value Ref Range   WBC 17.3 (H) 3.6 - 11.0 K/uL   RBC 4.73 3.80 - 5.20 MIL/uL   Hemoglobin 13.0 12.0 - 16.0 g/dL   HCT 13.039.7 86.535.0 - 78.447.0 %   MCV 84.0 80.0 - 100.0 fL   MCH 27.5 26.0 - 34.0 pg   MCHC 32.7 32.0 - 36.0 g/dL   RDW 69.614.5 29.511.5 - 28.414.5 %   Platelets 332 150 - 440 K/uL  Type and screen  [facility]     Status: None   Collection Time: 07/21/15  3:29 PM  Result Value Ref Range   ABO/RH(D) A POS    Antibody Screen NEG    Sample Expiration 07/24/2015   Rapid HIV screen (HIV 1/2 Ab+Ag) ([facility] Only)     Status: None   Collection Time: 07/21/15  3:29 PM  Result Value Ref Range   HIV-1 P24 Antigen - HIV24 NON REACTIVE NON REACTIVE   HIV 1/2 Antibodies NON REACTIVE NON REACTIVE   Interpretation (HIV Ag Ab)      A non reactive test result means that HIV 1 or HIV 2 antibodies and HIV 1 p24 antigen were not detected in the specimen.  ABO/Rh     Status: None  Collection Time: 07/21/15  3:30 PM  Result Value Ref Range   ABO/RH(D) A POS     Assessment : CHIDERA DEARCOS is a 30 y.o. G1P0 at [redacted]w[redacted]d being admitted for labor.  Plan: Labor: Induction of labor with Cervidil, once fetal tracing more reassuring.  FWB: Category II fetal heart tracing.  Placed on left side, and facemask O2. GBS negative Delivery plan: Hopeful for vaginal delivery.   Hildred Laser, MD [facility]

## 2015-07-21 NOTE — Transfer of Care (Signed)
Immediate Anesthesia Transfer of Care Note  Patient: Brenda Gutierrez  Procedure(s) Performed: Procedure(s): CESAREAN SECTION (N/A)  Patient Location: PACU  Anesthesia Type:Spinal  Level of Consciousness: awake, alert  and oriented  Airway & Oxygen Therapy: Patient Spontanous Breathing and Patient connected to face mask oxygen  Post-op Assessment: Report given to RN and Post -op Vital signs reviewed and stable  Post vital signs: Reviewed and stable  Last Vitals:  Filed Vitals:   07/21/15 1952 07/21/15 2207  BP: 122/73 101/82  Pulse: 86   Temp: 36.4 C 36.5 C  Resp: 16 14    Complications: No apparent anesthesia complications

## 2015-07-21 NOTE — Progress Notes (Signed)
Notifed by nurse that patient with prolonged deceleration lasting 7 minutes from baseline of 140s to 70s. Patient was turned to left and right lateral, facemask O2 utilized. Cervidil removed from vagina.  Subsequently has now been noted to have repetitive late decelerations beginning at [id number]. Will need to proceed to urgent C-section delivery at this time for non-reassuring fetal tracing.  The risks of cesarean section discussed with the patient included but were not limited to: bleeding which may require transfusion or reoperation; infection which may require antibiotics; injury to bowel, bladder, ureters or other surrounding organs; injury to the fetus; need for additional procedures including hysterectomy in the event of a life-threatening hemorrhage; placental abnormalities wth subsequent pregnancies, incisional problems, thromboembolic phenomenon and other postoperative/anesthesia complications. The patient concurred with the proposed plan, giving informed written consent for the procedure.   Patient has been NPO except clears since 0915, she will remain NPO for procedure. Anesthesia and OR aware. Preoperative prophylactic antibiotics and SCDs ordered on call to the OR.  To OR when ready.  Can give terbutaline for cessation of contractions until in the OR.

## 2015-07-21 NOTE — Telephone Encounter (Signed)
Called to check on pt and she was continuing to leak fluid. Per husband in L&d now.

## 2015-07-21 NOTE — Telephone Encounter (Signed)
Pts husband Chrissie Noa(william) states pt got up this am to use the restroom and noticed a white clear d/c. No vb or ctx this am. Has lost some of mucous plug over the last few weeks. Pos decreased FM( noted on last visit) Ac states d/t body habitus pt cant feel baby. Pt is in shower now. Advised to lay on left side and try to feel baby move. If ctx, vb, or fluid is leaking she will need to go to ER. Will call pt back at 11:00 to check on her. William aware no Dr in office today and we close at noon.

## 2015-07-21 NOTE — Progress Notes (Signed)
Intrapartum Progress Note  S: Patient denies complaints.  O: Blood pressure 119/64, pulse 102, temperature 97.8 F (36.6 C), temperature source Oral, height 5\' 4"  (1.626 m), weight 266 lb (120.657 kg), last menstrual period 10/25/2014. Gen App: NAD, comfortable Abdomen: soft, gravid FHT: baseline 140 bpm.  Accels present.  Decels present - occasional shallow variable. moderate in degree variability.   Tocometer: contractions q 2-4 minutes Cervix: deferred Extremities: Nontender, no edema.   Labs: No new labs   Assessment:  1: SIUP at 6357w3d 2. Category II tracing  3. Obesity  Plan:  1. Cervidil placed at ~ 5 pm. 2. Continue to monitor fetal status   Hildred LaserAnika Yassen Kinnett, MD

## 2015-07-21 NOTE — H&P (Addendum)
Obstetric History and Physical  Brenda Gutierrez is a 30 y.o. G1P0 with IUP at [redacted]w[redacted]d who presented initially with complaints of leaking fluid.  Her nitrazine test and fern test were negative for ruptured membranes.  However, patient was noted to have a spontaneous deceleration, lasting x 1 minute.  She was kept on continuous monitoring, where an additional 2 spontaneous decelerations were noted. . Patient states she has been having  irregular, every 2-6 minutes contractions, intermittently getting stronger. No vaginal bleeding, with decreased  fetal movement (although patient with increased body habitus, has had difficulty feeling baby move over past several weeks).    Prenatal Course Source of Care: [facility] with onset of care at 12 weeks Pregnancy complications or risks: Patient Active Problem List   Diagnosis Date Noted  . Non-reassuring fetal heart rate or rhythm affecting management of fetus 07/21/2015  . AFI (amniotic fluid index) increased 07/12/2015  . Decreased fetal movement in pregnancy in third trimester, antepartum 07/12/2015  . Fetal size inconsistent with dates 07/06/2015  . Labor and delivery, indication for care 06/26/2015  . Supervision of normal first pregnancy in third trimester 02/14/2015  . Obesity in pregnancy 01/20/2015  . Common migraine with intractable migraine 01/04/2014   She plans to breastfeed She desires oral contraceptives (estrogen/progesterone) for postpartum contraception.   Prenatal labs and studies: ABO, Rh: --/--/A POS (03/31 1530) Antibody: NEG (03/31 1529) Rubella: <0.90 (09/15 1030) RPR: Non Reactive (09/15 1030)  HBsAg: Negative (09/15 1030)  HIV: Non Reactive (09/15 1030)  UUV:OZDGUYQI (03/16 0000)Negative  (03/16  1429) 1 hr Glucola  normal Genetic screening normal Anatomy US normal   Past Medical History  Diagnosis Date  . Headache     migraines    Past Surgical History  Procedure Laterality Date  . Tonsillectomy     . 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 Tumor removal  2005    right breast-benign  . Ovarian cyst removal Left     OB History  Gravida Para Term Preterm AB SAB TAB Ectopic Multiple Living  1             # Outcome Date GA Lbr Len/2nd Weight Sex Delivery Anes PTL Lv  1 Current               Social History   Social History  . Marital Status: Married    Spouse Name: N/A  . Number of Children: N/A  . Years of Education: N/A   Occupational History  . fitness Psychologist, clinical   Social History Main Topics  . Smoking status: Former Games developer  . Smokeless tobacco: Never Used  . Alcohol Use: No     Comment: social  . Drug Use: No  . Sexual Activity:    Partners: Male   Other Topics Concern  . None   Social History Narrative    Family History  Problem Relation Age of Onset  . Hypertension Paternal Grandfather   . Hyperlipidemia Paternal Grandfather   . Hypertension Paternal Grandmother   . Hyperlipidemia Paternal Grandmother   . Depression Father   . Hypertension Father   . Diabetes Father   . Bipolar disorder Father   . Hyperlipidemia Father   . Diabetes Mother   . Depression Mother   . Anxiety disorder Mother   . Breast cancer Paternal Aunt     Prescriptions prior to admission  Medication Sig Dispense Refill Last Dose  . acetaminophen-codeine (TYLENOL #  3) 300-30 MG tablet Take 1-2 tablets by mouth every 6 (six) hours as needed for moderate pain. 30 tablet 1   . cetirizine (ZYRTEC) 5 MG tablet Take 5 mg by mouth daily.   Taking  . EPINEPHrine 0.3 mg/0.3 mL IJ SOAJ injection Inject into the muscle.   Taking  . IODINE, KELP, PO Take 225 mg by mouth daily. 1/2 tablet   Taking  . Prenatal Vit-Fe Fumarate-FA (MULTIVITAMIN-PRENATAL) 27-0.8 MG TABS tablet Take 1 tablet by mouth daily at 12 noon.   Taking    Allergies  Allergen Reactions  . Thatcher Doberstein Flavor [Flavoring Agent] Anaphylaxis  . Insect Extract Anaphylaxis  . Penicillin G Anaphylaxis  .  Penicillins Anaphylaxis  . Adhesive [Tape]   . Amoxicillin-Pot Clavulanate Hives  . Latex Rash    Review of Systems: Negative except for what is mentioned in HPI.  Physical Exam: BP 119/64 mmHg  Pulse 102  Temp(Src) 97.6 F (36.4 C) (Oral)  Resp 16  Ht 5\' 4"  (1.626 m)  Wt 266 lb (120.657 kg)  BMI 45.64 kg/m2  LMP 10/25/2014 (Exact Date) CONSTITUTIONAL: Well-developed, well-nourished female in no acute distress.  HENT:  Normocephalic, atraumatic, External right and left ear normal. Oropharynx is clear and moist EYES: Conjunctivae and EOM are normal. Pupils are equal, round, and reactive to light. No scleral icterus.  NECK: Normal range of motion, supple, no masses SKIN: Skin is warm and dry. No rash noted. Not diaphoretic. No erythema. No pallor. NEUROLOGIC: Alert and oriented to person, place, and time. Normal reflexes, muscle tone coordination. No cranial nerve deficit noted. PSYCHIATRIC: Normal mood and affect. Normal behavior. Normal judgment and thought content. CARDIOVASCULAR: Normal heart rate noted, regular rhythm RESPIRATORY: Effort and breath sounds normal, no problems with respiration noted ABDOMEN: Soft, nontender, nondistended, gravid. MUSCULOSKELETAL: Normal range of motion. No edema and no tenderness. 2+ distal pulses.  Cervical Exam: Dilatation  0.5 cm   Effacement 30 %   Station -3 Presentation: cephalic FHT:  Baseline rate 140 bpm   Variability moderate  Accelerations present   Decelerations variable with occasional late deceleration Contractions: Every 1-2 mins   Pertinent Labs/Studies:   Results for orders placed or performed during the hospital encounter of 07/21/15 (from the past 24 hour(s))  CBC     Status: Abnormal   Collection Time: 07/21/15  3:29 PM  Result Value Ref Range   WBC 17.3 (H) 3.6 - 11.0 K/uL   RBC 4.73 3.80 - 5.20 MIL/uL   Hemoglobin 13.0 12.0 - 16.0 g/dL   HCT 84.639.7 96.235.0 - 95.247.0 %   MCV 84.0 80.0 - 100.0 fL   MCH 27.5 26.0 - 34.0 pg    MCHC 32.7 32.0 - 36.0 g/dL   RDW 84.114.5 32.411.5 - 40.114.5 %   Platelets 332 150 - 440 K/uL  Type and screen [facility]     Status: None   Collection Time: 07/21/15  3:29 PM  Result Value Ref Range   ABO/RH(D) A POS    Antibody Screen NEG    Sample Expiration 07/24/2015   Rapid HIV screen (HIV 1/2 Ab+Ag) ([facility] Only)     Status: None   Collection Time: 07/21/15  3:29 PM  Result Value Ref Range   HIV-1 P24 Antigen - HIV24 NON REACTIVE NON REACTIVE   HIV 1/2 Antibodies NON REACTIVE NON REACTIVE   Interpretation (HIV Ag Ab)      A non reactive test result means that HIV 1 or HIV 2 antibodies and  HIV 1 p24 antigen were not detected in the specimen.  ABO/Rh     Status: None   Collection Time: 07/21/15  3:30 PM  Result Value Ref Range   ABO/RH(D) A POS     Bedside Ultrasound: AFI wnl 20 cm, active fetal movement present.    Assessment : Brenda Gutierrez is a 30 y.o. G1P0 at [redacted]w[redacted]d being admitted for labor.  Plan: Labor: Induction of labor with Cervidil, once fetal tracing more reassuring.  FWB: Category II fetal heart tracing.  Placed on left side, and facemask O2. GBS negative Delivery plan: Hopeful for vaginal delivery.   Hildred Laser, MD [facility]

## 2015-07-22 LAB — CBC
HEMATOCRIT: 33.3 % — AB (ref 35.0–47.0)
Hemoglobin: 11 g/dL — ABNORMAL LOW (ref 12.0–16.0)
MCH: 27.2 pg (ref 26.0–34.0)
MCHC: 33 g/dL (ref 32.0–36.0)
MCV: 82.2 fL (ref 80.0–100.0)
Platelets: 255 10*3/uL (ref 150–440)
RBC: 4.05 MIL/uL (ref 3.80–5.20)
RDW: 14.6 % — AB (ref 11.5–14.5)
WBC: 17.3 10*3/uL — ABNORMAL HIGH (ref 3.6–11.0)

## 2015-07-22 LAB — RPR: RPR: NONREACTIVE

## 2015-07-22 MED ORDER — ZOLPIDEM TARTRATE 5 MG PO TABS: 5.0000 mg | ORAL_TABLET | Freq: Every evening | ORAL | Status: DC | PRN

## 2015-07-22 MED ORDER — DIPHENHYDRAMINE HCL 50 MG/ML IJ SOLN: 12.5000 mg | INTRAMUSCULAR | Status: DC | PRN

## 2015-07-22 MED ORDER — ONDANSETRON HCL 4 MG/2ML IJ SOLN: 4.0000 mg | Freq: Three times a day (TID) | INTRAMUSCULAR | Status: DC | PRN

## 2015-07-22 MED ORDER — SCOPOLAMINE 1 MG/3DAYS TD PT72: 1.0000 | MEDICATED_PATCH | Freq: Once | TRANSDERMAL | Status: DC

## 2015-07-22 MED ORDER — SODIUM CHLORIDE 0.9% FLUSH: 3.0000 mL | INTRAVENOUS | Status: DC | PRN

## 2015-07-22 MED ORDER — LIDOCAINE 5 % EX PTCH
1.0000 | MEDICATED_PATCH | CUTANEOUS | Status: DC
  Administered 2015-07-22: 1 via TRANSDERMAL
  Filled 2015-07-22: qty 1

## 2015-07-22 MED ORDER — OXYCODONE-ACETAMINOPHEN 5-325 MG PO TABS
1.0000 | ORAL_TABLET | ORAL | Status: DC | PRN
  Administered 2015-07-23: 1 via ORAL
  Filled 2015-07-22: qty 1

## 2015-07-22 MED ORDER — IBUPROFEN 600 MG PO TABS
600.0000 mg | ORAL_TABLET | Freq: Four times a day (QID) | ORAL | Status: DC
  Administered 2015-07-22 – 2015-07-23 (×5): 600 mg via ORAL
  Filled 2015-07-22 (×5): qty 1

## 2015-07-22 MED ORDER — NALBUPHINE HCL 10 MG/ML IJ SOLN: 5.0000 mg | INTRAMUSCULAR | Status: DC | PRN

## 2015-07-22 MED ORDER — PRENATAL MULTIVITAMIN CH
1.0000 | ORAL_TABLET | Freq: Every day | ORAL | Status: DC
  Administered 2015-07-22 – 2015-07-23 (×2): 1 via ORAL
  Filled 2015-07-22 (×2): qty 1

## 2015-07-22 MED ORDER — NALBUPHINE HCL 10 MG/ML IJ SOLN: 5.0000 mg | Freq: Once | INTRAMUSCULAR | Status: DC | PRN

## 2015-07-22 MED ORDER — LACTATED RINGERS IV SOLN
INTRAVENOUS | Status: DC
  Administered 2015-07-22: 18:00:00 via INTRAVENOUS

## 2015-07-22 MED ORDER — LANOLIN HYDROUS EX OINT: 1.0000 "application " | TOPICAL_OINTMENT | CUTANEOUS | Status: DC | PRN

## 2015-07-22 MED ORDER — DIPHENHYDRAMINE HCL 25 MG PO CAPS: 25.0000 mg | ORAL_CAPSULE | ORAL | Status: DC | PRN

## 2015-07-22 MED ORDER — MENTHOL 3 MG MT LOZG
1.0000 | LOZENGE | OROMUCOSAL | Status: DC | PRN
  Filled 2015-07-22: qty 9

## 2015-07-22 MED ORDER — NALOXONE HCL 0.4 MG/ML IJ SOLN: 0.4000 mg | INTRAMUSCULAR | Status: DC | PRN

## 2015-07-22 MED ORDER — SIMETHICONE 80 MG PO CHEW: 80.0000 mg | CHEWABLE_TABLET | ORAL | Status: DC | PRN

## 2015-07-22 MED ORDER — OXYTOCIN 10 UNIT/ML IJ SOLN
2.5000 [IU]/h | INTRAVENOUS | Status: AC
  Filled 2015-07-22: qty 4

## 2015-07-22 MED ORDER — IBUPROFEN 600 MG PO TABS: 600.0000 mg | ORAL_TABLET | Freq: Four times a day (QID) | ORAL | Status: DC | PRN

## 2015-07-22 MED ORDER — ACETAMINOPHEN 325 MG PO TABS: 650.0000 mg | ORAL_TABLET | ORAL | Status: DC | PRN

## 2015-07-22 MED ORDER — DIPHENHYDRAMINE HCL 25 MG PO CAPS: 25.0000 mg | ORAL_CAPSULE | Freq: Four times a day (QID) | ORAL | Status: DC | PRN

## 2015-07-22 MED ORDER — MAGNESIUM HYDROXIDE 400 MG/5ML PO SUSP: 30.0000 mL | ORAL | Status: DC | PRN

## 2015-07-22 MED ORDER — OXYCODONE-ACETAMINOPHEN 5-325 MG PO TABS: 2.0000 | ORAL_TABLET | ORAL | Status: DC | PRN

## 2015-07-22 MED ORDER — NALOXONE HCL 2 MG/2ML IJ SOSY
1.0000 ug/kg/h | PREFILLED_SYRINGE | INTRAVENOUS | Status: DC | PRN
  Filled 2015-07-22: qty 2

## 2015-07-22 MED ORDER — SENNOSIDES-DOCUSATE SODIUM 8.6-50 MG PO TABS
2.0000 | ORAL_TABLET | ORAL | Status: DC
  Administered 2015-07-23: 2 via ORAL
  Filled 2015-07-22 (×2): qty 2

## 2015-07-22 MED ORDER — SIMETHICONE 80 MG PO CHEW
80.0000 mg | CHEWABLE_TABLET | ORAL | Status: DC
  Administered 2015-07-22 – 2015-07-23 (×2): 80 mg via ORAL
  Filled 2015-07-22 (×2): qty 1

## 2015-07-22 MED ORDER — MEASLES, MUMPS & RUBELLA VAC ~~LOC~~ INJ
0.5000 mL | INJECTION | Freq: Once | SUBCUTANEOUS | Status: AC
  Administered 2015-07-23: 0.5 mL via SUBCUTANEOUS
  Filled 2015-07-22 (×3): qty 0.5

## 2015-07-22 MED ORDER — DIBUCAINE 1 % RE OINT: 1.0000 "application " | TOPICAL_OINTMENT | RECTAL | Status: DC | PRN

## 2015-07-22 MED ORDER — WITCH HAZEL-GLYCERIN EX PADS: 1.0000 "application " | MEDICATED_PAD | CUTANEOUS | Status: DC | PRN

## 2015-07-22 NOTE — Anesthesia Post-op Follow-up Note (Signed)
  Anesthesia Pain Follow-up Note  Patient: Brenda Gutierrez  Day #: 1  Date of Follow-up: 07/22/2015 Time: 8:34 AM  Last Vitals:  Filed Vitals:   07/22/15 0429 07/22/15 0819  BP: 114/70 112/64  Pulse: 103 98  Temp: 36.7 C 37.1 C  Resp: 20 20    Level of Consciousness: alert  Pain: mild   Side Effects:None  Catheter Site Exam:clean  Plan: Continue current therapy  Yevette EdwardsJames G Carter Kassel

## 2015-07-22 NOTE — Anesthesia Postprocedure Evaluation (Signed)
Anesthesia Post Note  Patient: Brenda Gutierrez  Procedure(s) Performed: Procedure(s) (LRB): CESAREAN SECTION (N/A)  Patient location during evaluation: PACU Anesthesia Type: Spinal Level of consciousness: oriented and awake and alert Pain management: pain level controlled Vital Signs Assessment: post-procedure vital signs reviewed and stable Respiratory status: spontaneous breathing, respiratory function stable and patient connected to nasal cannula oxygen Cardiovascular status: blood pressure returned to baseline and stable Postop Assessment: no headache and no backache Anesthetic complications: no    Last Vitals:  Filed Vitals:   07/22/15 0429 07/22/15 0819  BP: 114/70 112/64  Pulse: 103 98  Temp: 36.7 C 37.1 C  Resp: 20 20    Last Pain:  Filed Vitals:   07/22/15 0819  PainSc: 1                  Yevette EdwardsJames G Nikky Duba

## 2015-07-22 NOTE — Progress Notes (Signed)
Postpartum Day # 1: Cesarean Delivery  Subjective: Patient reports tolerating PO.  Denies nausea/vomiting.  Pain controlled with medications. Not yet ambulating.   Objective: Vital signs in last 24 hours: Temp:  [97.6 F (36.4 C)-98.7 F (37.1 C)] 98.7 F (37.1 C) (04/01 0819) Pulse Rate:  [73-103] 98 (04/01 0819) Resp:  [14-20] 20 (04/01 0819) BP: (101-149)/(61-82) 112/64 mmHg (04/01 0819) SpO2:  [96 %-97 %] 97 % (04/01 0819) Weight:  [266 lb (120.657 kg)] 266 lb (120.657 kg) (03/31 1122)  Physical Exam:  General: alert and no distress Lungs: clear to auscultation bilaterally Breasts: normal appearance, no masses or tenderness Heart: regular rate and rhythm, S1, S2 normal, no murmur, click, rub or gallop Pelvis: Lochia appropriate, Uterine Fundus firm, Incision: healing well, no dehiscence, no significant erythema, dressing with moderate amount of old blood.  Extremities: DVT Evaluation: Negative Homan's sign. No cords or calf tenderness. No significant calf/ankle edema.   Recent Labs  07/21/15 1529 07/22/15 0543  HGB 13.0 11.0*  HCT 39.7 33.3*    Assessment/Plan: Status post Cesarean section. Doing well postoperatively.  Breastfeeding and Contraception OCPs Advance diet Continue PO pain management Remove foley catheter Discontinue IVF Continue current care. Changed from honeycomb dressing to pressure dressing.   Hildred LaserAnika Mai Longnecker [facility]

## 2015-07-23 MED ORDER — OXYCODONE-ACETAMINOPHEN 5-325 MG PO TABS: 1.0000 | ORAL_TABLET | ORAL | Status: DC | PRN

## 2015-07-23 MED ORDER — DOCUSATE SODIUM 100 MG PO CAPS: 100.0000 mg | ORAL_CAPSULE | Freq: Two times a day (BID) | ORAL | Status: DC | PRN

## 2015-07-23 MED ORDER — IBUPROFEN 800 MG PO TABS: 800.0000 mg | ORAL_TABLET | Freq: Three times a day (TID) | ORAL | Status: DC | PRN

## 2015-07-23 NOTE — Discharge Instructions (Signed)
Call your doctor for increased pain or vaginal bleeding, temperature above 100.4, depression, or concerns.  No strenuous activity or heavy lifting for 6 weeks.  No intercourse, tampons, douching, or enemas for 6 weeks.  No tub baths-showers only.  No driving for 2 weeks or while taking pain medications.  Continue prenatal vitamin and iron.  Keep incision clean and dry.  Call your doctor for incision concerns including redness, swelling, bleeding or drainage, or if begins to come apart.  Increase calories and fluids while breastfeeding. °

## 2015-07-23 NOTE — Progress Notes (Signed)
Dressing changed. Pt discharged home with infant.

## 2015-07-23 NOTE — Progress Notes (Signed)
Pt states that she received TDaP and Influenza vaccines during pregnancy.  Pt declines these vaccines at this time. Reynold BowenSusan Paisley Daxton Nydam, RN 07/23/2015 5:04 PM

## 2015-07-23 NOTE — Discharge Summary (Signed)
Obstetric Discharge Summary Reason for Admission: induction of labor and non-reassuring fetal tracing Prenatal Procedures: NST and ultrasound Intrapartum Procedures: cesarean: low cervical, transverse Postpartum Procedures: Rubella Ig Complications-Operative and Postpartum: none HEMOGLOBIN  Date Value Ref Range Status  07/22/2015 11.0* 12.0 - 16.0 g/dL Final   HCT  Date Value Ref Range Status  07/22/2015 33.3* 35.0 - 47.0 % Final   HEMATOCRIT  Date Value Ref Range Status  05/25/2015 34.8 34.0 - 46.6 % Final    Physical Exam:  General: alert and no distress Lochia: appropriate Uterine Fundus: firm Incision: healing well, no significant drainage, no dehiscence, no significant erythema DVT Evaluation: Negative Homan's sign. No cords or calf tenderness. No significant calf/ankle edema.  Discharge Diagnoses: Term Pregnancy-delivered  Discharge Information: Date: 07/23/2015 Activity: pelvic rest Diet: routine Medications: PNV, Ibuprofen, Colace and Percocet Condition: stable Instructions: refer to practice specific booklet Discharge to: home Follow-up Information    Follow up with Hildred LaserAnika Yomaira Solar, MD In 1 week.   Specialties:  Obstetrics and Gynecology, Radiology   Why:   For wound re-check   Contact information:   [address] [place] [address] [phone]       Newborn Data: Live born female  Birth Weight: 7 lb 4.8 oz (3310 g) APGAR: 8, 9  Home with mother.  Hildred Lasernika Kimmberly Wisser 07/23/2015, 1:27 PM

## 2015-07-23 NOTE — Progress Notes (Signed)
Postpartum Day # 2: Cesarean Delivery  Subjective: Patient reports tolerating PO and no problems voiding.  Pain controlled with medications. Ambulating without difficulty.   Objective: Vital signs in last 24 hours: Temp:  [97.8 F (36.6 C)-98.7 F (37.1 C)] 98.7 F (37.1 C) (04/02 0834) Pulse Rate:  [92-107] 92 (04/02 0834) Resp:  [18-20] 18 (04/02 0834) BP: (104-120)/(56-75) 117/72 mmHg (04/02 0834) SpO2:  [97 %-100 %] 98 % (04/02 0834)  Physical Exam:  General: alert and no distress Lungs: clear to auscultation bilaterally Breasts: normal appearance, no masses or tenderness Heart: regular rate and rhythm, S1, S2 normal, no murmur, click, rub or gallop Pelvis: Lochia appropriate, Uterine Fundus firm, Incision: healing well, no dehiscence, no significant erythema, dressing with moderate amount of old blood.  Extremities: DVT Evaluation: Negative Homan's sign. No cords or calf tenderness. No significant calf/ankle edema.   Recent Labs  07/21/15 1529 07/22/15 0543  HGB 13.0 11.0*  HCT 39.7 33.3*    Assessment/Plan: Status post Cesarean section. Doing well postoperatively.  Breastfeeding and Contraception OCPs D/c home.  To remove dressing in office.    Hildred LaserAnika Evanell Redlich [facility]

## 2015-07-24 ENCOUNTER — Encounter: Payer: Self-pay | Admitting: Obstetrics and Gynecology

## 2015-07-25 ENCOUNTER — Ambulatory Visit (INDEPENDENT_AMBULATORY_CARE_PROVIDER_SITE_OTHER): Payer: BLUE CROSS/BLUE SHIELD | Admitting: Obstetrics and Gynecology

## 2015-07-25 ENCOUNTER — Other Ambulatory Visit: Payer: BLUE CROSS/BLUE SHIELD

## 2015-07-25 ENCOUNTER — Encounter: Payer: Self-pay | Admitting: Obstetrics and Gynecology

## 2015-07-25 VITALS — BP 129/85 | HR 101 | Ht 64.0 in | Wt 262.4 lb

## 2015-07-25 DIAGNOSIS — Z98891 History of uterine scar from previous surgery: Secondary | ICD-10-CM

## 2015-07-25 DIAGNOSIS — Z9889 Other specified postprocedural states: Secondary | ICD-10-CM

## 2015-07-25 NOTE — Progress Notes (Signed)
   OBSTETRICS POST-OPERATIVE CLINIC VISIT  Subjective:     Brenda Gutierrez is a 30 y.o. 671P1001 female who presents to the clinic 4 days status post primary low-transverse C-section for non-reassuring fetal tracing. Eating a regular diet without difficulty. Bowel movements are normal. Pain is controlled with current analgesics. Medications being used: prescription NSAID's including ibuprofen (Motrin) and narcotic analgesics including oxycodone/acetaminophen (Percocet, Tylox).  The following portions of the patient's history were reviewed and updated as appropriate: allergies, current medications, past family history, past medical history, past social history, past surgical history and problem list.  Review of Systems Pertinent items noted in HPI and remainder of comprehensive ROS otherwise negative.    Objective:    BP 129/85 mmHg  Pulse 101  Ht 5\' 4"  (1.626 m)  Wt 262 lb 6.4 oz (119.024 kg)  BMI 45.02 kg/m2  LMP 10/25/2014 (Exact Date)  Breastfeeding? Yes General:  alert and no distress  Abdomen: soft, bowel sounds active, non-tender  Incision:   healing well, no drainage, no erythema, no hernia, no seroma, no swelling, no dehiscence, incision well approximated.  Steri-strips in place.      Assessment:    Doing well postoperatively.  S/p Cesarean section   Plan:    1. Continue any current medications. 2. Wound care discussed. 3. Activity restrictions: no bending, stooping, or squatting, no lifting more than 15 pounds and pelvic rest x 5 weeks 4. Anticipated return to work: 5 weeks. 5. Follow up: 5 weeks for final post-op/postpartum check.     Hildred LaserAnika Zanyiah Posten, MD [facility]

## 2015-08-01 ENCOUNTER — Inpatient Hospital Stay: Admission: RE | Admit: 2015-08-01 | Payer: Self-pay | Source: Ambulatory Visit | Admitting: Obstetrics and Gynecology

## 2015-08-31 ENCOUNTER — Ambulatory Visit (INDEPENDENT_AMBULATORY_CARE_PROVIDER_SITE_OTHER): Payer: BLUE CROSS/BLUE SHIELD | Admitting: Obstetrics and Gynecology

## 2015-08-31 ENCOUNTER — Encounter: Payer: Self-pay | Admitting: Obstetrics and Gynecology

## 2015-08-31 DIAGNOSIS — Z30013 Encounter for initial prescription of injectable contraceptive: Secondary | ICD-10-CM

## 2015-08-31 MED ORDER — MEDROXYPROGESTERONE ACETATE 150 MG/ML IM SUSP: 150.0000 mg | INTRAMUSCULAR | Status: DC

## 2015-08-31 NOTE — Progress Notes (Signed)
   OBSTETRICS POSTPARTUM CLINIC PROGRESS NOTE  Subjective:     Brenda Gutierrez is a 30 y.o. 101P1001 female who presents for a postpartum visit. She is 6 weeks postpartum following a low cervical transverse Cesarean section. I have fully reviewed the prenatal and intrapartum course. The delivery was at 38 gestational weeks (for NRFHT).  Anesthesia: spinal. Postpartum course has been well. Baby's course has been well. Baby is feeding by breast. Bleeding: patient has not resumed menses, with No LMP recorded.. Bowel function is normal. Bladder function is normal. Patient is not sexually active. Contraception method desired is undecided. Postpartum depression screening: negative (PHQ-9 score is 3).  The following portions of the patient's history were reviewed and updated as appropriate: allergies, current medications, past family history, past medical history, past social history, past surgical history and problem list.  Review of Systems Pertinent items noted in HPI and remainder of comprehensive ROS otherwise negative.   Objective:    BP 103/68 mmHg  Pulse 85  Ht 5\' 4"  (1.626 m)  Wt 242 lb 8 oz (109.997 kg)  BMI 41.60 kg/m2  Breastfeeding? Yes  General:  alert and no distress, morbidly obese   Breasts:  inspection negative, no nipple discharge or bleeding, no masses or nodularity palpable  Lungs: clear to auscultation bilaterally  Heart:  regular rate and rhythm, S1, S2 normal, no murmur, click, rub or gallop  Abdomen: soft, non-tender; bowel sounds normal; no masses,  no organomegaly.  Well healed Pfannenstiel incision   Vulva:  normal  Vagina: normal vagina, no discharge, exudate, lesion, or erythema  Cervix:  no cervical motion tenderness and no lesions  Corpus: normal size, contour, position, consistency, mobility, non-tender  Adnexa:  normal adnexa and no mass, fullness, tenderness  Rectal Exam: Not performed.         Labs:  Lab Results  Component Value Date   HGB 11.0*  07/22/2015    Assessment:   Routine postpartum exam, s/p primary low transverse C-section.   Obesity Class III Contraception  Plan:    1. Contraception: Depo-Provera injections and undecided.  After discussion of contraceptive options, patient desires Depo provera.  Patient notes that she previously used to self-administer.  Desires to do this again. Will prescribe.  2. Counseled on weight management postpartum.  3. Follow up in: 3-6 months for annual exam or as needed.    Hildred LaserAnika Ginny Loomer, MD [facility]

## 2016-08-14 ENCOUNTER — Telehealth: Payer: Self-pay | Admitting: Obstetrics and Gynecology

## 2016-08-14 DIAGNOSIS — Z3042 Encounter for surveillance of injectable contraceptive: Secondary | ICD-10-CM

## 2016-08-14 MED ORDER — MEDROXYPROGESTERONE ACETATE 150 MG/ML IM SUSP: 150.0000 mg | INTRAMUSCULAR | 0 refills | Status: DC

## 2016-08-14 NOTE — Telephone Encounter (Signed)
RX sent in

## 2016-08-14 NOTE — Telephone Encounter (Signed)
Patient is out of town and forgot to pick up Brenda Gutierrez from pharmacy  Could it be sent to [facility] [place] phone  # [phone] She wants to pick up tomorrow

## 2017-04-02 ENCOUNTER — Telehealth: Payer: Self-pay | Admitting: Obstetrics and Gynecology

## 2017-04-02 MED ORDER — ONDANSETRON HCL 4 MG PO TABS: 4.0000 mg | ORAL_TABLET | Freq: Three times a day (TID) | ORAL | 0 refills | Status: DC | PRN

## 2017-04-02 MED ORDER — DOXYLAMINE-PYRIDOXINE ER 20-20 MG PO TBCR: 20.0000 mg | EXTENDED_RELEASE_TABLET | Freq: Two times a day (BID) | ORAL | 1 refills | Status: DC

## 2017-04-02 NOTE — Telephone Encounter (Signed)
Pt is scheduled for OV on 05/02/17 for pregnancy. LMP 02/28/17. Pt is requesting a nurse to return her call to discuss her morning sickness. Please advise. Thanks TNP

## 2017-04-02 NOTE — Telephone Encounter (Signed)
Pt called in and states she tried taking dramamine for motion sickness to help her morning sickness but this is not helping her nausea. Spoke with Dr. Valentino Saxonherry and she informed me of what could be sent in. Pt verfied which pharmacy and rx was sent in.

## 2017-04-03 ENCOUNTER — Other Ambulatory Visit: Payer: Self-pay

## 2017-04-03 MED ORDER — DOXYLAMINE-PYRIDOXINE 10-10 MG PO TBEC: 10.0000 mg | DELAYED_RELEASE_TABLET | Freq: Every day | ORAL | 1 refills | Status: DC

## 2017-04-17 ENCOUNTER — Telehealth: Payer: Self-pay | Admitting: *Deleted

## 2017-04-17 MED ORDER — ONDANSETRON HCL 4 MG PO TABS: 4.0000 mg | ORAL_TABLET | Freq: Three times a day (TID) | ORAL | 0 refills | Status: DC | PRN

## 2017-04-17 NOTE — Telephone Encounter (Signed)
Patient called states she needs a refill of her Zofran. Her pharmacy is [facility] on [address]. Please advise. Thank you

## 2017-04-17 NOTE — Telephone Encounter (Signed)
Refill sent to patient's pharmacy and pt was made aware. Nothing further is needed.

## 2017-05-02 ENCOUNTER — Ambulatory Visit (INDEPENDENT_AMBULATORY_CARE_PROVIDER_SITE_OTHER): Payer: 59 | Admitting: Obstetrics and Gynecology

## 2017-05-02 VITALS — BP 113/67 | HR 78 | Wt 236.0 lb

## 2017-05-02 DIAGNOSIS — Z3481 Encounter for supervision of other normal pregnancy, first trimester: Secondary | ICD-10-CM

## 2017-05-02 DIAGNOSIS — R638 Other symptoms and signs concerning food and fluid intake: Secondary | ICD-10-CM

## 2017-05-02 NOTE — Progress Notes (Signed)
Brenda Gutierrez presents for NOB nurse interview visit. Pregnancy confirmation done 03/28/17 [facility]______.  G- 2.  P-  1  . Pregnancy education material explained and given. __0_ cats in the home. NOB labs ordered. (TSH/HbgA1c due to Increased BMI), (sickle cell). HIV labs and Drug screen were explained optional and she did not decline. Drug screen ordered. PNV encouraged. Genetic screening options discussed. Genetic testing: Ordered/Declined/Unsure.  Pt may discuss with provider. Pt. To follow up with provider in 2__ weeks for NOB physical.  All questions answered.

## 2017-05-02 NOTE — Progress Notes (Signed)
I have reviewed the record and concur with patient management and plan.  Fatumata Kashani, MD [facility]     

## 2017-05-03 LAB — CBC WITH DIFFERENTIAL
BASOS ABS: 0 10*3/uL (ref 0.0–0.2)
Basos: 0 %
EOS (ABSOLUTE): 0.2 10*3/uL (ref 0.0–0.4)
Eos: 1 %
HEMOGLOBIN: 12.7 g/dL (ref 11.1–15.9)
Hematocrit: 40 % (ref 34.0–46.6)
Immature Grans (Abs): 0 10*3/uL (ref 0.0–0.1)
Immature Granulocytes: 0 %
LYMPHS: 24 %
Lymphocytes Absolute: 3 10*3/uL (ref 0.7–3.1)
MCH: 28.2 pg (ref 26.6–33.0)
MCHC: 31.8 g/dL (ref 31.5–35.7)
MCV: 89 fL (ref 79–97)
MONOCYTES: 6 %
Monocytes Absolute: 0.8 10*3/uL (ref 0.1–0.9)
NEUTROS PCT: 69 %
Neutrophils Absolute: 8.5 10*3/uL — ABNORMAL HIGH (ref 1.4–7.0)
RBC: 4.51 x10E6/uL (ref 3.77–5.28)
RDW: 14.1 % (ref 12.3–15.4)
WBC: 12.5 10*3/uL — AB (ref 3.4–10.8)

## 2017-05-03 LAB — URINALYSIS, ROUTINE W REFLEX MICROSCOPIC
Bilirubin, UA: NEGATIVE
GLUCOSE, UA: NEGATIVE
KETONES UA: NEGATIVE
LEUKOCYTES UA: NEGATIVE
Nitrite, UA: NEGATIVE
PROTEIN UA: NEGATIVE
RBC, UA: NEGATIVE
Urobilinogen, Ur: 0.2 mg/dL (ref 0.2–1.0)
pH, UA: 5 (ref 5.0–7.5)

## 2017-05-03 LAB — VARICELLA ZOSTER ANTIBODY, IGG: Varicella zoster IgG: 675 index (ref >165)

## 2017-05-03 LAB — HEPATITIS B SURFACE ANTIGEN: HEP B S AG: NEGATIVE

## 2017-05-03 LAB — RPR: RPR: NONREACTIVE

## 2017-05-03 LAB — HEMOGLOBIN A1C
Est. average glucose Bld gHb Est-mCnc: 105 mg/dL
HEMOGLOBIN A1C: 5.3 % (ref 4.8–5.6)

## 2017-05-03 LAB — RUBELLA SCREEN: RUBELLA: 1.42 {index} (ref >0.99)

## 2017-05-03 LAB — ABO AND RH: Rh Factor: POSITIVE

## 2017-05-03 LAB — HIV ANTIBODY (ROUTINE TESTING W REFLEX): HIV SCREEN 4TH GENERATION: NONREACTIVE

## 2017-05-03 LAB — TSH: TSH: 1.52 u[IU]/mL (ref 0.450–4.500)

## 2017-05-04 LAB — URINE CULTURE

## 2017-05-04 LAB — GC/CHLAMYDIA PROBE AMP
Chlamydia trachomatis, NAA: NEGATIVE
Neisseria gonorrhoeae by PCR: NEGATIVE

## 2017-05-05 LAB — MONITOR DRUG PROFILE 14(MW)
Amphetamine Scrn, Ur: NEGATIVE ng/mL
BARBITURATE SCREEN URINE: NEGATIVE ng/mL
BENZODIAZEPINE SCREEN, URINE: NEGATIVE ng/mL
Buprenorphine, Urine: NEGATIVE ng/mL
CANNABINOIDS UR QL SCN: NEGATIVE ng/mL
COCAINE(METAB.)SCREEN, URINE: NEGATIVE ng/mL
CREATININE(CRT), U: 221.4 mg/dL (ref 20.0–300.0)
Fentanyl, Urine: NEGATIVE pg/mL
MEPERIDINE SCREEN, URINE: NEGATIVE ng/mL
METHADONE SCREEN, URINE: NEGATIVE ng/mL
OXYCODONE+OXYMORPHONE UR QL SCN: NEGATIVE ng/mL
Opiate Scrn, Ur: NEGATIVE ng/mL
PH UR, DRUG SCRN: 5.2 (ref 4.5–8.9)
Phencyclidine Qn, Ur: NEGATIVE ng/mL
Propoxyphene Scrn, Ur: NEGATIVE ng/mL
SPECIFIC GRAVITY: 1.033
Tramadol Screen, Urine: NEGATIVE ng/mL

## 2017-05-05 LAB — NICOTINE SCREEN, URINE: COTININE UR QL SCN: NEGATIVE ng/mL

## 2017-05-13 ENCOUNTER — Ambulatory Visit (INDEPENDENT_AMBULATORY_CARE_PROVIDER_SITE_OTHER): Payer: 59 | Admitting: Obstetrics & Gynecology

## 2017-05-13 ENCOUNTER — Encounter: Payer: Self-pay | Admitting: Obstetrics & Gynecology

## 2017-05-13 VITALS — BP 117/75 | HR 91 | Wt 241.0 lb

## 2017-05-13 DIAGNOSIS — O0990 Supervision of high risk pregnancy, unspecified, unspecified trimester: Secondary | ICD-10-CM | POA: Insufficient documentation

## 2017-05-13 DIAGNOSIS — O219 Vomiting of pregnancy, unspecified: Secondary | ICD-10-CM

## 2017-05-13 DIAGNOSIS — O34219 Maternal care for unspecified type scar from previous cesarean delivery: Secondary | ICD-10-CM | POA: Insufficient documentation

## 2017-05-13 DIAGNOSIS — Z3480 Encounter for supervision of other normal pregnancy, unspecified trimester: Secondary | ICD-10-CM

## 2017-05-13 DIAGNOSIS — Z3481 Encounter for supervision of other normal pregnancy, first trimester: Secondary | ICD-10-CM

## 2017-05-13 MED ORDER — ONDANSETRON HCL 4 MG PO TABS: 4.0000 mg | ORAL_TABLET | Freq: Three times a day (TID) | ORAL | 3 refills | Status: DC | PRN

## 2017-05-13 NOTE — Progress Notes (Signed)
DATING AND VIABILITY SONOGRAM   Lavonna RuaBrandy B Yeakel is a 32 y.o. year old G2P1001 with LMP Patient's last menstrual period was 02/28/2017 (exact date). which would correlate to  6933w4d weeks gestation.  She has regular menstrual cycles.   She is here today for a confirmatory initial sonogram.    GESTATION: SINGLETON   FETAL ACTIVITY:          Heart rate  166               The fetus is active.166  Gestational criteria: Estimated Date of Delivery: 12/05/17 by LMP now at 5433w4d  Previous Scans:O            CROWN RUMP LENGTH           3.23CM      10 weeks 4 days                               AVERAGE EGA [redacted] weeks 4 days  WORKING EDD ( 12/05/2017    TECHNICIAN COMMENTS:  SLIUP measuring 10 weeks and 4 days by CRL/FL/HC with heart rate of 166.    A copy of this report including all images has been saved and backed up to a second source for retrieval if needed. All measures and details of the anatomical scan, placentation, fluid volume and pelvic anatomy are contained in that report.  Lamontae Ricardo Cheree DittoGraham 05/13/2017 10:24 AM

## 2017-05-13 NOTE — Progress Notes (Signed)
Subjective:   Brenda Gutierrez is a 32 y.o. G2P1001 at [redacted]w[redacted]d by LMP, early ultrasound being seen today for her first obstetrical visit.  Her obstetrical history is significant for previous cesarean section. Patient does intend to breast feed. Pregnancy history fully reviewed.  Patient reports nausea and desires Zofran refill.  HISTORY: Obstetric History   G2   P1   T1   P0   A0   L1    SAB0   TAB0   Ectopic0   Multiple0   Live Births1     # Outcome Date GA Lbr Len/2nd Weight Sex Delivery Anes PTL Lv  2 Current           1 Term 07/21/15 [redacted]w[redacted]d  7 lb 4.8 oz (3.31 kg) F CS-LVertical Spinal  LIV     Name: Brenda Gutierrez     Apgar1:  8                Apgar5: 9     Past Medical History:  Diagnosis Date  . Headache    migraines   Past Surgical History:  Procedure Laterality Date  . CESAREAN SECTION N/A 07/21/2015   Procedure: CESAREAN SECTION;  Surgeon: Hildred Laser, MD;  Location: [facility] ORS;  Service: Obstetrics;  Laterality: N/A;  . OVARIAN CYST REMOVAL Left   . TONSILLECTOMY    . TUMOR REMOVAL  2003   throat-benign  . TUMOR REMOVAL  2005   right breast-benign   Family History  Problem Relation Age of Onset  . Diabetes Mother   . Depression Mother   . Anxiety disorder Mother   . Depression Father   . Hypertension Father   . Diabetes Father   . Bipolar disorder Father   . Hyperlipidemia Father   . Hypertension Paternal Grandmother   . Hyperlipidemia Paternal Grandmother   . Hypertension Paternal Grandfather   . Hyperlipidemia Paternal Grandfather   . Breast cancer Paternal Aunt    Social History   Tobacco Use  . Smoking status: Former Games developer  . Smokeless tobacco: Never Used  Substance Use Topics  . Alcohol use: No    Alcohol/week: 0.0 oz    Comment: social  . Drug use: No   Allergies  Allergen Reactions  . Bee Pollen Anaphylaxis  . Cherry Flavor [Flavoring Agent] Anaphylaxis  . Insect Extract Allergy Skin Test Anaphylaxis  . Penicillin G  Anaphylaxis  . Penicillins Anaphylaxis  . Adhesive [Tape]   . Amoxicillin-Pot Clavulanate Hives  . Red Dye   . Latex Rash   Current Outpatient Medications on File Prior to Visit  Medication Sig Dispense Refill  . EPINEPHrine 0.3 mg/0.3 mL IJ SOAJ injection Inject into the muscle.    . Prenatal Vit-Fe Fumarate-FA (MULTIVITAMIN-PRENATAL) 27-0.8 MG TABS tablet Take 1 tablet by mouth daily at 12 noon.     No current facility-administered medications on file prior to visit.      Exam   Vitals:   05/13/17 0938  BP: 117/75  Pulse: 91  Weight: 241 lb (109.3 kg)   Fetal Heart Rate (bpm): 166  Uterus:     Pelvic Exam: Deferred   System: General: well-developed, well-nourished female in no acute distress   Breast:  normal appearance, no masses or tenderness   Skin: normal coloration and turgor, no rashes   Neurologic: oriented, normal, negative, normal mood   Extremities: normal strength, tone, and muscle mass, ROM of all joints is normal   HEENT PERRLA, extraocular movement intact  and sclera clear, anicteric   Mouth/Teeth mucous membranes moist, pharynx normal without lesions and dental hygiene good   Neck supple and no masses   Cardiovascular: regular rate and rhythm   Respiratory:  no respiratory distress, normal breath sounds   Abdomen: soft, non-tender; bowel sounds normal; no masses,  no organomegaly     Assessment:   Pregnancy: G2P1001 Patient Active Problem List   Diagnosis Date Noted  . Supervision of other normal pregnancy, antepartum 05/13/2017  . Previous cesarean delivery for NRFHT in 2017 05/13/2017  . Obesity in pregnancy 01/20/2015  . Common migraine with intractable migraine 01/04/2014     Plan:  1. Previous cesarean delivery for NRFHT in 2017 Undecided about TOLAC vs RCS for now.  2. Nausea and vomiting in pregnancy - ondansetron (ZOFRAN) 4 MG tablet; Take 1 tablet (4 mg total) by mouth every 8 (eight) hours as needed for nausea or vomiting.  Dispense:  20 tablet; Refill: 3  3. Supervision of other normal pregnancy, antepartum Had initial labs done at [facility]; other labs ordered - Genetic Screening - Antibody screen Continue prenatal vitamins. Genetic Screening discussed, NIPS: ordered. Ultrasound discussed; fetal anatomic survey: to be ordered later. Problem list reviewed and updated. The nature of  [facility] with multiple MDs and other Advanced Practice Providers was explained to patient; also emphasized that residents, students are part of our team. Routine obstetric precautions reviewed. Return in about 4 weeks (around 06/10/2017) for OB Visit.     Jaynie CollinsUGONNA  Rendi Mapel, MD, FACOG Obstetrician & Gynecologist, [facility], [facility]

## 2017-05-14 LAB — ANTIBODY SCREEN: Antibody Screen: NEGATIVE

## 2017-05-20 ENCOUNTER — Encounter: Payer: BLUE CROSS/BLUE SHIELD | Admitting: Obstetrics and Gynecology

## 2017-05-26 ENCOUNTER — Encounter: Payer: Self-pay | Admitting: *Deleted

## 2017-05-29 ENCOUNTER — Encounter: Payer: 59 | Admitting: Obstetrics and Gynecology

## 2017-06-09 ENCOUNTER — Ambulatory Visit (INDEPENDENT_AMBULATORY_CARE_PROVIDER_SITE_OTHER): Payer: 59 | Admitting: Family Medicine

## 2017-06-09 ENCOUNTER — Encounter: Payer: Self-pay | Admitting: Family Medicine

## 2017-06-09 VITALS — BP 112/74 | HR 80 | Wt 242.0 lb

## 2017-06-09 DIAGNOSIS — Z3480 Encounter for supervision of other normal pregnancy, unspecified trimester: Secondary | ICD-10-CM

## 2017-06-09 DIAGNOSIS — Z3482 Encounter for supervision of other normal pregnancy, second trimester: Secondary | ICD-10-CM

## 2017-06-09 DIAGNOSIS — O219 Vomiting of pregnancy, unspecified: Secondary | ICD-10-CM

## 2017-06-09 DIAGNOSIS — O34219 Maternal care for unspecified type scar from previous cesarean delivery: Secondary | ICD-10-CM

## 2017-06-09 MED ORDER — DOXYLAMINE-PYRIDOXINE 10-10 MG PO TBEC: DELAYED_RELEASE_TABLET | ORAL | 6 refills | Status: DC

## 2017-06-09 NOTE — Progress Notes (Signed)
   PRENATAL VISIT NOTE  Subjective:  Brenda Gutierrez is a 32 y.o. G2P1001 at 1172w3d being seen today for ongoing prenatal care.  She is currently monitored for the following issues for this low-risk pregnancy and has Common migraine with intractable migraine; Obesity in pregnancy; Supervision of other normal pregnancy, antepartum; and Previous cesarean delivery for NRFHT in 2017 on their problem list.  Patient reports heartburn.   .  .   . Denies leaking of fluid.   The following portions of the patient's history were reviewed and updated as appropriate: allergies, current medications, past family history, past medical history, past social history, past surgical history and problem list. Problem list updated.  Objective:   Vitals:   06/09/17 1450  BP: 112/74  Pulse: 80  Weight: 242 lb (109.8 kg)    Fetal Status:           General:  Alert, oriented and cooperative. Patient is in no acute distress.  Skin: Skin is warm and dry. No rash noted.   Cardiovascular: Normal heart rate noted  Respiratory: Normal respiratory effort, no problems with respiration noted  Abdomen: Soft, gravid, appropriate for gestational age.        Pelvic: Cervical exam deferred        Extremities: Normal range of motion.     Mental Status:  Normal mood and affect. Normal behavior. Normal judgment and thought content.   Assessment and Plan:  Pregnancy: G2P1001 at 3372w3d  1. Supervision of other normal pregnancy, antepartum FHT normal  2. Previous cesarean delivery for NRFHT in 2017 Desires TOLAC - consent reviewed and signed  3. Nausea and vomiting in pregnancy Discussed B6 and unisom  Preterm labor symptoms and general obstetric precautions including but not limited to vaginal bleeding, contractions, leaking of fluid and fetal movement were reviewed in detail with the patient. Please refer to After Visit Summary for other counseling recommendations.  Return in about 4 weeks (around 07/07/2017) for OB  f/u.   Levie HeritageJacob J Kert Shackett, DO

## 2017-06-09 NOTE — Progress Notes (Signed)
Wants to talk about doing a BTS

## 2017-06-10 ENCOUNTER — Encounter: Payer: 59 | Admitting: Obstetrics and Gynecology

## 2017-06-10 ENCOUNTER — Other Ambulatory Visit: Payer: Self-pay | Admitting: *Deleted

## 2017-06-10 DIAGNOSIS — Z3480 Encounter for supervision of other normal pregnancy, unspecified trimester: Secondary | ICD-10-CM

## 2017-06-12 DIAGNOSIS — Z3480 Encounter for supervision of other normal pregnancy, unspecified trimester: Secondary | ICD-10-CM

## 2017-06-18 ENCOUNTER — Encounter: Payer: Self-pay | Admitting: *Deleted

## 2017-07-07 ENCOUNTER — Encounter: Payer: Self-pay | Admitting: Obstetrics and Gynecology

## 2017-07-07 ENCOUNTER — Ambulatory Visit (INDEPENDENT_AMBULATORY_CARE_PROVIDER_SITE_OTHER): Payer: 59 | Admitting: Obstetrics and Gynecology

## 2017-07-07 VITALS — BP 107/73 | HR 90 | Wt 245.0 lb

## 2017-07-07 DIAGNOSIS — O34219 Maternal care for unspecified type scar from previous cesarean delivery: Secondary | ICD-10-CM

## 2017-07-07 DIAGNOSIS — Z3480 Encounter for supervision of other normal pregnancy, unspecified trimester: Secondary | ICD-10-CM

## 2017-07-07 DIAGNOSIS — O99210 Obesity complicating pregnancy, unspecified trimester: Secondary | ICD-10-CM

## 2017-07-07 NOTE — Progress Notes (Signed)
Prenatal Visit Note Date: 07/07/2017 [facility]  Subjective:  Brenda Gutierrez is a 32 y.o. G2P1001 at 5132w3d being seen today for ongoing prenatal care.  She is currently monitored for the following issues for this low-risk pregnancy and has Common migraine with intractable migraine; Obesity in pregnancy; Supervision of other normal pregnancy, antepartum; and Previous cesarean delivery for NRFHT in 2017 on their problem list.  Patient reports no complaints.   Contractions: Not present. Vag. Bleeding: None.  Movement: Present. Denies leaking of fluid.   The following portions of the patient's history were reviewed and updated as appropriate: allergies, current medications, past family history, past medical history, past social history, past surgical history and problem list. Problem list updated.  Objective:   Vitals:   07/07/17 1542  BP: 107/73  Pulse: 90  Weight: 245 lb (111.1 kg)    Fetal Status: Fetal Heart Rate (bpm): 142   Movement: Present     General:  Alert, oriented and cooperative. Patient is in no acute distress.  Skin: Skin is warm and dry. No rash noted.   Cardiovascular: Normal heart rate noted  Respiratory: Normal respiratory effort, no problems with respiration noted  Abdomen: Soft, gravid, appropriate for gestational age. Pain/Pressure: Absent     Pelvic:  Cervical exam deferred        Extremities: Normal range of motion.  Edema: None  Mental Status: Normal mood and affect. Normal behavior. Normal judgment and thought content.   Urinalysis:      Assessment and Plan:  Pregnancy: G2P1001 at 4732w3d  1. Obesity in pregnancy Routine care. Weight on track. Goals d/w her  2. Previous cesarean delivery for NRFHT in 2017 D/w pt later in pregnancy re: delivery mode  3. Supervision of other normal pregnancy, antepartum Declines afp. Anatomy u/s to be scheduled. F/u per baby scripts - US MFM OB DETAIL +14 WK; Future  Preterm  labor symptoms and general obstetric precautions including but not limited to vaginal bleeding, contractions, leaking of fluid and fetal movement were reviewed in detail with the patient. Please refer to After Visit Summary for other counseling recommendations.  No Follow-up on file.   Lyndon BingPickens, Maryruth Apple, MD

## 2017-07-22 ENCOUNTER — Encounter: Payer: Self-pay | Admitting: *Deleted

## 2017-07-24 ENCOUNTER — Telehealth: Payer: Self-pay

## 2017-07-24 NOTE — Telephone Encounter (Signed)
Received call from patient concerning hip pain. Patient stated she hurt her pain a couple weeks ago and would like something stronger called in for her pain. I advised patient we do not call in pain medications and she would need to schedule appointment with one of our providers. Patient agreeable with plan and has transferred to the recep desk.

## 2017-07-24 NOTE — Telephone Encounter (Signed)
-----   Message from Lindell SparHeather L Bacon, [place] sent at 07/24/2017 10:23 AM EDT ----- Regarding: please call pt Contact: [phone] Patient is requesting a call, wanting to know about possibly refill pain medication due to hip pain

## 2017-07-25 ENCOUNTER — Ambulatory Visit (HOSPITAL_COMMUNITY)
Admission: RE | Admit: 2017-07-25 | Discharge: 2017-07-25 | Disposition: A | Discharge status: Home / Self Care | Payer: 59 | Source: Ambulatory Visit | Attending: Obstetrics and Gynecology | Admitting: Obstetrics and Gynecology

## 2017-07-25 ENCOUNTER — Encounter (HOSPITAL_COMMUNITY): Payer: Self-pay

## 2017-07-25 ENCOUNTER — Other Ambulatory Visit: Payer: Self-pay | Admitting: Obstetrics and Gynecology

## 2017-07-25 DIAGNOSIS — Z3480 Encounter for supervision of other normal pregnancy, unspecified trimester: Secondary | ICD-10-CM

## 2017-07-25 DIAGNOSIS — O99212 Obesity complicating pregnancy, second trimester: Secondary | ICD-10-CM | POA: Insufficient documentation

## 2017-07-25 DIAGNOSIS — Z3689 Encounter for other specified antenatal screening: Secondary | ICD-10-CM

## 2017-07-25 DIAGNOSIS — Z3A21 21 weeks gestation of pregnancy: Secondary | ICD-10-CM | POA: Insufficient documentation

## 2017-07-25 DIAGNOSIS — O34219 Maternal care for unspecified type scar from previous cesarean delivery: Secondary | ICD-10-CM | POA: Diagnosis not present

## 2017-07-25 DIAGNOSIS — O99210 Obesity complicating pregnancy, unspecified trimester: Secondary | ICD-10-CM

## 2017-07-25 NOTE — Addendum Note (Signed)
Encounter addended by: Raoul PitchMurrow, Dontaye Hur Mae on: 07/25/2017 4:15 PM  Actions taken: Imaging Exam ended

## 2017-07-28 ENCOUNTER — Other Ambulatory Visit (HOSPITAL_COMMUNITY): Payer: Self-pay | Admitting: *Deleted

## 2017-07-28 DIAGNOSIS — Z362 Encounter for other antenatal screening follow-up: Secondary | ICD-10-CM

## 2017-08-04 ENCOUNTER — Ambulatory Visit (INDEPENDENT_AMBULATORY_CARE_PROVIDER_SITE_OTHER): Payer: 59 | Admitting: Obstetrics and Gynecology

## 2017-08-04 ENCOUNTER — Encounter: Payer: Self-pay | Admitting: Obstetrics and Gynecology

## 2017-08-04 VITALS — BP 121/69 | HR 88 | Wt 251.0 lb

## 2017-08-04 DIAGNOSIS — Z3480 Encounter for supervision of other normal pregnancy, unspecified trimester: Secondary | ICD-10-CM

## 2017-08-04 DIAGNOSIS — M25551 Pain in right hip: Secondary | ICD-10-CM | POA: Insufficient documentation

## 2017-08-04 DIAGNOSIS — Z3482 Encounter for supervision of other normal pregnancy, second trimester: Secondary | ICD-10-CM

## 2017-08-04 NOTE — Progress Notes (Signed)
Prenatal Visit Note Date: 08/04/2017 Clinic: [facility]  Subjective:  Brenda Gutierrez is a 32 y.o. G2P1001 at 334w3d being seen today for ongoing prenatal care.  She is currently monitored for the following issues for this low-risk pregnancy and has Common migraine with intractable migraine; Obesity in pregnancy; Supervision of other normal pregnancy, antepartum; and Previous cesarean delivery for NRFHT in 2017 on their problem list.  Patient reports right hip pain (see below)..   Contractions: Not present. Vag. Bleeding: None.  Movement: Present. Denies leaking of fluid.   The following portions of the patient's history were reviewed and updated as appropriate: allergies, current medications, past family history, past medical history, past social history, past surgical history and problem list. Problem list updated.  Objective:   Vitals:   08/04/17 1551  BP: 121/69  Pulse: 88  Weight: 251 lb (113.9 kg)    Fetal Status: Fetal Heart Rate (bpm): 147   Movement: Present     General:  Alert, oriented and cooperative. Patient is in no acute distress.  Skin: Skin is warm and dry. No rash noted.   Cardiovascular: Normal heart rate noted  Respiratory: Normal respiratory effort, no problems with respiration noted  Abdomen: Soft, gravid, appropriate for gestational age. Pain/Pressure: Present     Pelvic:  Cervical exam deferred        Extremities: Normal range of motion.  Edema: None  Mental Status: Normal mood and affect. Normal behavior. Normal judgment and thought content.   Urinalysis:      Assessment and Plan:  Pregnancy: G2P1001 at 2334w3d  1. Supervision of other normal pregnancy, antepartum Routine care. Has completion anatomy u/s already scheduled  2. Right hip pain For about the past four weeks, which she hurt when she was mowing the lawn. Has had some chiropractor visits which help but doesn't provide long lasting relief. I recommended PT and massages,  which she is amenable to. With her insurance, she has to call and get PT approved first. Pt told to let us know if she needs anything from us and/or if there's any questions re: medications that are okay to use. Will see her back sooner than usual to see how her hip is doing.  - Ambulatory referral to Physical Therapy  Preterm labor symptoms and general obstetric precautions including but not limited to vaginal bleeding, contractions, leaking of fluid and fetal movement were reviewed in detail with the patient. Please refer to After Visit Summary for other counseling recommendations.  Return in about 2 weeks (around 08/18/2017) for 2-3wk rob.   [place] BingPickens, Janesha Brissette, MD

## 2017-08-25 ENCOUNTER — Encounter (HOSPITAL_COMMUNITY): Payer: Self-pay

## 2017-08-25 ENCOUNTER — Other Ambulatory Visit (HOSPITAL_COMMUNITY): Payer: Self-pay | Admitting: *Deleted

## 2017-08-25 ENCOUNTER — Ambulatory Visit (HOSPITAL_COMMUNITY)
Admission: RE | Admit: 2017-08-25 | Discharge: 2017-08-25 | Disposition: A | Discharge status: Home / Self Care | Payer: 59 | Source: Ambulatory Visit | Attending: Obstetrics and Gynecology | Admitting: Obstetrics and Gynecology

## 2017-08-25 DIAGNOSIS — Z3A25 25 weeks gestation of pregnancy: Secondary | ICD-10-CM | POA: Insufficient documentation

## 2017-08-25 DIAGNOSIS — O99212 Obesity complicating pregnancy, second trimester: Secondary | ICD-10-CM | POA: Diagnosis present

## 2017-08-25 DIAGNOSIS — Z362 Encounter for other antenatal screening follow-up: Secondary | ICD-10-CM | POA: Diagnosis not present

## 2017-08-25 DIAGNOSIS — O34219 Maternal care for unspecified type scar from previous cesarean delivery: Secondary | ICD-10-CM | POA: Insufficient documentation

## 2017-08-26 ENCOUNTER — Encounter: Payer: Self-pay | Admitting: Family Medicine

## 2017-08-26 ENCOUNTER — Ambulatory Visit (INDEPENDENT_AMBULATORY_CARE_PROVIDER_SITE_OTHER): Payer: 59 | Admitting: Family Medicine

## 2017-08-26 VITALS — BP 109/69 | HR 72 | Wt 256.2 lb

## 2017-08-26 DIAGNOSIS — M25551 Pain in right hip: Secondary | ICD-10-CM

## 2017-08-26 DIAGNOSIS — Z3480 Encounter for supervision of other normal pregnancy, unspecified trimester: Secondary | ICD-10-CM

## 2017-08-26 MED ORDER — PREDNISONE 10 MG PO TABS: 40.0000 mg | ORAL_TABLET | Freq: Every day | ORAL | 0 refills | Status: DC

## 2017-08-26 NOTE — Progress Notes (Signed)
   PRENATAL VISIT NOTE  Subjective:  Brenda Gutierrez is a 32 y.o. G2P1001 at [redacted]w[redacted]d being seen today for ongoing prenatal care.  She is currently monitored for the following issues for this low-risk pregnancy and has Common migraine with intractable migraine; Obesity in pregnancy; Supervision of other normal pregnancy, antepartum; Previous cesarean delivery for NRFHT in 2017; and Right hip pain on their problem list.  Patient reports right hip pain.  Contractions: Not present.  .  Movement: Present. Denies leaking of fluid.   The following portions of the patient's history were reviewed and updated as appropriate: allergies, current medications, past family history, past medical history, past social history, past surgical history and problem list. Problem list updated.  Objective:   Vitals:   08/26/17 1438  BP: 109/69  Pulse: 72  Weight: 256 lb 3.2 oz (116.2 kg)    Fetal Status: Fetal Heart Rate (bpm): 159   Movement: Present     General:  Alert, oriented and cooperative. Patient is in no acute distress.  Skin: Skin is warm and dry. No rash noted.   Cardiovascular: Normal heart rate noted  Respiratory: Normal respiratory effort, no problems with respiration noted  Abdomen: Soft, gravid, appropriate for gestational age.  Pain/Pressure: Present     Pelvic: Cervical exam deferred        Extremities: Normal range of motion.  Edema: None  Mental Status: Normal mood and affect. Normal behavior. Normal judgment and thought content.   Assessment and Plan:  Pregnancy: G2P1001 at [redacted]w[redacted]d  1. Supervision of other normal pregnancy, antepartum Continue routine prenatal care. 28 wk labs next visit  2. Right hip pain Has tried and failed chiropractor, heat.  Using ice, and exercise helps.  Began post push mowing. Taking tylenol which is not helping. Cannot take NSAIDS. Short burst of steroid recommended. If not helpful, call back and consider PT referral. - predniSONE (DELTASONE) 10 MG  tablet; Take 4 tablets (40 mg total) by mouth daily with breakfast.  Dispense: 20 tablet; Refill: 0  Preterm labor symptoms and general obstetric precautions including but not limited to vaginal bleeding, contractions, leaking of fluid and fetal movement were reviewed in detail with the patient. Please refer to After Visit Summary for other counseling recommendations.  Return in 3 weeks (on 09/16/2017) for 28 wk labs.  Future Appointments  Date Time Provider Department Center  09/12/2017 10:00 AM Major Bing, MD [facility]  09/22/2017  9:30 AM [facility] [place] [facility]    Reva Bores, MD

## 2017-08-26 NOTE — Patient Instructions (Signed)
 Second Trimester of Pregnancy The second trimester is from week 14 through week 27 (months 4 through 6). The second trimester is often a time when you feel your best. Your body has adjusted to being pregnant, and you begin to feel better physically. Usually, morning sickness has lessened or quit completely, you may have more energy, and you may have an increase in appetite. The second trimester is also a time when the fetus is growing rapidly. At the end of the sixth month, the fetus is about 9 inches long and weighs about 1 pounds. You will likely begin to feel the baby move (quickening) between 16 and 20 weeks of pregnancy. Body changes during your second trimester Your body continues to go through many changes during your second trimester. The changes vary from woman to woman.  Your weight will continue to increase. You will notice your lower abdomen bulging out.  You may begin to get stretch marks on your hips, abdomen, and breasts.  You may develop headaches that can be relieved by medicines. The medicines should be approved by your health care provider.  You may urinate more often because the fetus is pressing on your bladder.  You may develop or continue to have heartburn as a result of your pregnancy.  You may develop constipation because certain hormones are causing the muscles that push waste through your intestines to slow down.  You may develop hemorrhoids or swollen, bulging veins (varicose veins).  You may have back pain. This is caused by: ? Weight gain. ? Pregnancy hormones that are relaxing the joints in your pelvis. ? A shift in weight and the muscles that support your balance.  Your breasts will continue to grow and they will continue to become tender.  Your gums may bleed and may be sensitive to brushing and flossing.  Dark spots or blotches (chloasma, mask of pregnancy) may develop on your face. This will likely fade after the baby is born.  A dark line from  your belly button to the pubic area (linea nigra) may appear. This will likely fade after the baby is born.  You may have changes in your hair. These can include thickening of your hair, rapid growth, and changes in texture. Some women also have hair loss during or after pregnancy, or hair that feels dry or thin. Your hair will most likely return to normal after your baby is born.  What to expect at prenatal visits During a routine prenatal visit:  You will be weighed to make sure you and the fetus are growing normally.  Your blood pressure will be taken.  Your abdomen will be measured to track your baby's growth.  The fetal heartbeat will be listened to.  Any test results from the previous visit will be discussed.  Your health care provider may ask you:  How you are feeling.  If you are feeling the baby move.  If you have had any abnormal symptoms, such as leaking fluid, bleeding, severe headaches, or abdominal cramping.  If you are using any tobacco products, including cigarettes, chewing tobacco, and electronic cigarettes.  If you have any questions.  Other tests that may be performed during your second trimester include:  Blood tests that check for: ? Low iron levels (anemia). ? High blood sugar that affects pregnant women (gestational diabetes) between 24 and 28 weeks. ? Rh antibodies. This is to check for a protein on red blood cells (Rh factor).  Urine tests to check for infections, diabetes,   or protein in the urine.  An ultrasound to confirm the proper growth and development of the baby.  An amniocentesis to check for possible genetic problems.  Fetal screens for spina bifida and Down syndrome.  HIV (human immunodeficiency virus) testing. Routine prenatal testing includes screening for HIV, unless you choose not to have this test.  Follow these instructions at home: Medicines  Follow your health care provider's instructions regarding medicine use. Specific  medicines may be either safe or unsafe to take during pregnancy.  Take a prenatal vitamin that contains at least 600 micrograms (mcg) of folic acid.  If you develop constipation, try taking a stool softener if your health care provider approves. Eating and drinking  Eat a balanced diet that includes fresh fruits and vegetables, whole grains, good sources of protein such as meat, eggs, or tofu, and low-fat dairy. Your health care provider will help you determine the amount of weight gain that is right for you.  Avoid raw meat and uncooked cheese. These carry germs that can cause birth defects in the baby.  If you have low calcium intake from food, talk to your health care provider about whether you should take a daily calcium supplement.  Limit foods that are high in fat and processed sugars, such as fried and sweet foods.  To prevent constipation: ? Drink enough fluid to keep your urine clear or pale yellow. ? Eat foods that are high in fiber, such as fresh fruits and vegetables, whole grains, and beans. Activity  Exercise only as directed by your health care provider. Most women can continue their usual exercise routine during pregnancy. Try to exercise for 30 minutes at least 5 days a week. Stop exercising if you experience uterine contractions.  Avoid heavy lifting, wear low heel shoes, and practice good posture.  A sexual relationship may be continued unless your health care provider directs you otherwise. Relieving pain and discomfort  Wear a good support bra to prevent discomfort from breast tenderness.  Take warm sitz baths to soothe any pain or discomfort caused by hemorrhoids. Use hemorrhoid cream if your health care provider approves.  Rest with your legs elevated if you have leg cramps or low back pain.  If you develop varicose veins, wear support hose. Elevate your feet for 15 minutes, 3-4 times a day. Limit salt in your diet. Prenatal Care  Write down your questions.  Take them to your prenatal visits.  Keep all your prenatal visits as told by your health care provider. This is important. Safety  Wear your seat belt at all times when driving.  Make a list of emergency phone numbers, including numbers for family, friends, the hospital, and police and fire departments. General instructions  Ask your health care provider for a referral to a local prenatal education class. Begin classes no later than the beginning of month 6 of your pregnancy.  Ask for help if you have counseling or nutritional needs during pregnancy. Your health care provider can offer advice or refer you to specialists for help with various needs.  Do not use hot tubs, steam rooms, or saunas.  Do not douche or use tampons or scented sanitary pads.  Do not cross your legs for long periods of time.  Avoid cat litter boxes and soil used by cats. These carry germs that can cause birth defects in the baby and possibly loss of the fetus by miscarriage or stillbirth.  Avoid all smoking, herbs, alcohol, and unprescribed drugs. Chemicals in these products   can affect the formation and growth of the baby.  Do not use any products that contain nicotine or tobacco, such as cigarettes and e-cigarettes. If you need help quitting, ask your health care provider.  Visit your dentist if you have not gone yet during your pregnancy. Use a soft toothbrush to brush your teeth and be gentle when you floss. Contact a health care provider if:  You have dizziness.  You have mild pelvic cramps, pelvic pressure, or nagging pain in the abdominal area.  You have persistent nausea, vomiting, or diarrhea.  You have a bad smelling vaginal discharge.  You have pain when you urinate. Get help right away if:  You have a fever.  You are leaking fluid from your vagina.  You have spotting or bleeding from your vagina.  You have severe abdominal cramping or pain.  You have rapid weight gain or weight  loss.  You have shortness of breath with chest pain.  You notice sudden or extreme swelling of your face, hands, ankles, feet, or legs.  You have not felt your baby move in over an hour.  You have severe headaches that do not go away when you take medicine.  You have vision changes. Summary  The second trimester is from week 14 through week 27 (months 4 through 6). It is also a time when the fetus is growing rapidly.  Your body goes through many changes during pregnancy. The changes vary from woman to woman.  Avoid all smoking, herbs, alcohol, and unprescribed drugs. These chemicals affect the formation and growth your baby.  Do not use any tobacco products, such as cigarettes, chewing tobacco, and e-cigarettes. If you need help quitting, ask your health care provider.  Contact your health care provider if you have any questions. Keep all prenatal visits as told by your health care provider. This is important. This information is not intended to replace advice given to you by your health care provider. Make sure you discuss any questions you have with your health care provider. Document Released: 04/02/2001 Document Revised: 05/14/2016 Document Reviewed: 05/14/2016 Else[employer] Interactive Patient Education  2018 [employer]   Breastfeeding Choosing to breastfeed is one of the best decisions you can make for yourself and your baby. A change in hormones during pregnancy causes your breasts to make breast milk in your milk-producing glands. Hormones prevent breast milk from being released before your baby is born. They also prompt milk flow after birth. Once breastfeeding has begun, thoughts of your baby, as well as his or her sucking or crying, can stimulate the release of milk from your milk-producing glands. Benefits of breastfeeding Research shows that breastfeeding offers many health benefits for infants and mothers. It also offers a cost-free and convenient way to feed your  baby. For your baby  Your first milk (colostrum) helps your baby's digestive system to function better.  Special cells in your milk (antibodies) help your baby to fight off infections.  Breastfed babies are less likely to develop asthma, allergies, obesity, or type 2 diabetes. They are also at lower risk for sudden infant death syndrome (SIDS).  Nutrients in breast milk are better able to meet your baby's needs compared to infant formula.  Breast milk improves your baby's brain development. For you  Breastfeeding helps to create a very special bond between you and your baby.  Breastfeeding is convenient. Breast milk costs nothing and is always available at the correct temperature.  Breastfeeding helps to burn calories. It helps you   to lose the weight that you gained during pregnancy.  Breastfeeding makes your uterus return faster to its size before pregnancy. It also slows bleeding (lochia) after you give birth.  Breastfeeding helps to lower your risk of developing type 2 diabetes, osteoporosis, rheumatoid arthritis, cardiovascular disease, and breast, ovarian, uterine, and endometrial cancer later in life. Breastfeeding basics Starting breastfeeding  Find a comfortable place to sit or lie down, with your neck and back well-supported.  Place a pillow or a rolled-up blanket under your baby to bring him or her to the level of your breast (if you are seated). Nursing pillows are specially designed to help support your arms and your baby while you breastfeed.  Make sure that your baby's tummy (abdomen) is facing your abdomen.  Gently massage your breast. With your fingertips, massage from the outer edges of your breast inward toward the nipple. This encourages milk flow. If your milk flows slowly, you may need to continue this action during the feeding.  Support your breast with 4 fingers underneath and your thumb above your nipple (make the letter "C" with your hand). Make sure your  fingers are well away from your nipple and your baby's mouth.  Stroke your baby's lips gently with your finger or nipple.  When your baby's mouth is open wide enough, quickly bring your baby to your breast, placing your entire nipple and as much of the areola as possible into your baby's mouth. The areola is the colored area around your nipple. ? More areola should be visible above your baby's upper lip than below the lower lip. ? Your baby's lips should be opened and extended outward (flanged) to ensure an adequate, comfortable latch. ? Your baby's tongue should be between his or her lower gum and your breast.  Make sure that your baby's mouth is correctly positioned around your nipple (latched). Your baby's lips should create a seal on your breast and be turned out (everted).  It is common for your baby to suck about 2-3 minutes in order to start the flow of breast milk. Latching Teaching your baby how to latch onto your breast properly is very important. An improper latch can cause nipple pain, decreased milk supply, and poor weight gain in your baby. Also, if your baby is not latched onto your nipple properly, he or she may swallow some air during feeding. This can make your baby fussy. Burping your baby when you switch breasts during the feeding can help to get rid of the air. However, teaching your baby to latch on properly is still the best way to prevent fussiness from swallowing air while breastfeeding. Signs that your baby has successfully latched onto your nipple  Silent tugging or silent sucking, without causing you pain. Infant's lips should be extended outward (flanged).  Swallowing heard between every 3-4 sucks once your milk has started to flow (after your let-down milk reflex occurs).  Muscle movement above and in front of his or her ears while sucking.  Signs that your baby has not successfully latched onto your nipple  Sucking sounds or smacking sounds from your baby while  breastfeeding.  Nipple pain.  If you think your baby has not latched on correctly, slip your finger into the corner of your baby's mouth to break the suction and place it between your baby's gums. Attempt to start breastfeeding again. Signs of successful breastfeeding Signs from your baby  Your baby will gradually decrease the number of sucks or will completely stop sucking.    Your baby will fall asleep.  Your baby's body will relax.  Your baby will retain a small amount of milk in his or her mouth.  Your baby will let go of your breast by himself or herself.  Signs from you  Breasts that have increased in firmness, weight, and size 1-3 hours after feeding.  Breasts that are softer immediately after breastfeeding.  Increased milk volume, as well as a change in milk consistency and color by the fifth day of breastfeeding.  Nipples that are not sore, cracked, or bleeding.  Signs that your baby is getting enough milk  Wetting at least 1-2 diapers during the first 24 hours after birth.  Wetting at least 5-6 diapers every 24 hours for the first week after birth. The urine should be clear or pale yellow by the age of 5 days.  Wetting 6-8 diapers every 24 hours as your baby continues to grow and develop.  At least 3 stools in a 24-hour period by the age of 5 days. The stool should be soft and yellow.  At least 3 stools in a 24-hour period by the age of 7 days. The stool should be seedy and yellow.  No loss of weight greater than 10% of birth weight during the first 3 days of life.  Average weight gain of 4-7 oz (113-198 g) per week after the age of 4 days.  Consistent daily weight gain by the age of 5 days, without weight loss after the age of 2 weeks. After a feeding, your baby may spit up a small amount of milk. This is normal. Breastfeeding frequency and duration Frequent feeding will help you make more milk and can prevent sore nipples and extremely full breasts (breast  engorgement). Breastfeed when you feel the need to reduce the fullness of your breasts or when your baby shows signs of hunger. This is called "breastfeeding on demand." Signs that your baby is hungry include:  Increased alertness, activity, or restlessness.  Movement of the head from side to side.  Opening of the mouth when the corner of the mouth or cheek is stroked (rooting).  Increased sucking sounds, smacking lips, cooing, sighing, or squeaking.  Hand-to-mouth movements and sucking on fingers or hands.  Fussing or crying.  Avoid introducing a pacifier to your baby in the first 4-6 weeks after your baby is born. After this time, you may choose to use a pacifier. Research has shown that pacifier use during the first year of a baby's life decreases the risk of sudden infant death syndrome (SIDS). Allow your baby to feed on each breast as long as he or she wants. When your baby unlatches or falls asleep while feeding from the first breast, offer the second breast. Because newborns are often sleepy in the first few weeks of life, you may need to awaken your baby to get him or her to feed. Breastfeeding times will vary from baby to baby. However, the following rules can serve as a guide to help you make sure that your baby is properly fed:  Newborns (babies 4 weeks of age or younger) may breastfeed every 1-3 hours.  Newborns should not go without breastfeeding for longer than 3 hours during the day or 5 hours during the night.  You should breastfeed your baby a minimum of 8 times in a 24-hour period.  Breast milk pumping Pumping and storing breast milk allows you to make sure that your baby is exclusively fed your breast milk, even at times   when you are unable to breastfeed. This is especially important if you go back to work while you are still breastfeeding, or if you are not able to be present during feedings. Your lactation consultant can help you find a method of pumping that works best  for you and give you guidelines about how long it is safe to store breast milk. Caring for your breasts while you breastfeed Nipples can become dry, cracked, and sore while breastfeeding. The following recommendations can help keep your breasts moisturized and healthy:  Avoid using soap on your nipples.  Wear a supportive bra designed especially for nursing. Avoid wearing underwire-style bras or extremely tight bras (sports bras).  Air-dry your nipples for 3-4 minutes after each feeding.  Use only cotton bra pads to absorb leaked breast milk. Leaking of breast milk between feedings is normal.  Use lanolin on your nipples after breastfeeding. Lanolin helps to maintain your skin's normal moisture barrier. Pure lanolin is not harmful (not toxic) to your baby. You may also hand express a few drops of breast milk and gently massage that milk into your nipples and allow the milk to air-dry.  In the first few weeks after giving birth, some women experience breast engorgement. Engorgement can make your breasts feel heavy, warm, and tender to the touch. Engorgement peaks within 3-5 days after you give birth. The following recommendations can help to ease engorgement:  Completely empty your breasts while breastfeeding or pumping. You may want to start by applying warm, moist heat (in the shower or with warm, water-soaked hand towels) just before feeding or pumping. This increases circulation and helps the milk flow. If your baby does not completely empty your breasts while breastfeeding, pump any extra milk after he or she is finished.  Apply ice packs to your breasts immediately after breastfeeding or pumping, unless this is too uncomfortable for you. To do this: ? Put ice in a plastic bag. ? Place a towel between your skin and the bag. ? Leave the ice on for 20 minutes, 2-3 times a day.  Make sure that your baby is latched on and positioned properly while breastfeeding.  If engorgement persists  after 48 hours of following these recommendations, contact your health care provider or a lactation consultant. Overall health care recommendations while breastfeeding  Eat 3 healthy meals and 3 snacks every day. Well-nourished mothers who are breastfeeding need an additional 450-500 calories a day. You can meet this requirement by increasing the amount of a balanced diet that you eat.  Drink enough water to keep your urine pale yellow or clear.  Rest often, relax, and continue to take your prenatal vitamins to prevent fatigue, stress, and low vitamin and mineral levels in your body (nutrient deficiencies).  Do not use any products that contain nicotine or tobacco, such as cigarettes and e-cigarettes. Your baby may be harmed by chemicals from cigarettes that pass into breast milk and exposure to secondhand smoke. If you need help quitting, ask your health care provider.  Avoid alcohol.  Do not use illegal drugs or marijuana.  Talk with your health care provider before taking any medicines. These include over-the-counter and prescription medicines as well as vitamins and herbal supplements. Some medicines that may be harmful to your baby can pass through breast milk.  It is possible to become pregnant while breastfeeding. If birth control is desired, ask your health care provider about options that will be safe while breastfeeding your baby. Where to find more information: [employer]: www.llli.org Contact a health care provider if:  You feel like you want to stop breastfeeding or have become frustrated with breastfeeding.  Your nipples are cracked or bleeding.  Your breasts are red, tender, or warm.  You have: ? Painful breasts or nipples. ? A swollen area on either breast. ? A fever or chills. ? Nausea or vomiting. ? Drainage other than breast milk from your nipples.  Your breasts do not become full before feedings by the fifth day after you give birth.  You  feel sad and depressed.  Your baby is: ? Too sleepy to eat well. ? Having trouble sleeping. ? More than 1 week old and wetting fewer than 6 diapers in a 24-hour period. ? Not gaining weight by 5 days of age.  Your baby has fewer than 3 stools in a 24-hour period.  Your baby's skin or the white parts of his or her eyes become yellow. Get help right away if:  Your baby is overly tired (lethargic) and does not want to wake up and feed.  Your baby develops an unexplained fever. Summary  Breastfeeding offers many health benefits for infant and mothers.  Try to breastfeed your infant when he or she shows early signs of hunger.  Gently tickle or stroke your baby's lips with your finger or nipple to allow the baby to open his or her mouth. Bring the baby to your breast. Make sure that much of the areola is in your baby's mouth. Offer one side and burp the baby before you offer the other side.  Talk with your health care provider or lactation consultant if you have questions or you face problems as you breastfeed. This information is not intended to replace advice given to you by your health care provider. Make sure you discuss any questions you have with your health care provider. Document Released: 04/08/2005 Document Revised: 05/10/2016 Document Reviewed: 05/10/2016 Elsevier Interactive Patient Education  2018 [employer]  

## 2017-09-03 ENCOUNTER — Other Ambulatory Visit: Payer: Self-pay | Admitting: *Deleted

## 2017-09-03 DIAGNOSIS — M25551 Pain in right hip: Secondary | ICD-10-CM

## 2017-09-03 DIAGNOSIS — Z3480 Encounter for supervision of other normal pregnancy, unspecified trimester: Secondary | ICD-10-CM

## 2017-09-03 MED ORDER — PREDNISONE 5 MG PO TABS: ORAL_TABLET | ORAL | 0 refills | Status: DC

## 2017-09-03 MED ORDER — PREDNISONE 10 MG (21) PO TBPK: ORAL_TABLET | ORAL | 0 refills | Status: DC

## 2017-09-03 NOTE — Addendum Note (Signed)
Addended by: Arne Cleveland on: 09/03/2017 01:47 PM   Modules accepted: Orders

## 2017-09-03 NOTE — Progress Notes (Signed)
Pt called - LM on nurse line. States she is having right hip pain after completing steroid boost. States she felt great while taking steroids but once she finished it then the pain started back. Spoke to Dr Alvester Morin in office today. Recommend steroid taper with PT. Orders have been placed and LM for pt to rtn call.

## 2017-09-12 ENCOUNTER — Ambulatory Visit (INDEPENDENT_AMBULATORY_CARE_PROVIDER_SITE_OTHER): Payer: 59 | Admitting: Obstetrics and Gynecology

## 2017-09-12 ENCOUNTER — Encounter: Payer: Self-pay | Admitting: Obstetrics and Gynecology

## 2017-09-12 VITALS — BP 138/80 | HR 114 | Wt 255.0 lb

## 2017-09-12 DIAGNOSIS — M25551 Pain in right hip: Secondary | ICD-10-CM

## 2017-09-12 DIAGNOSIS — Z23 Encounter for immunization: Secondary | ICD-10-CM

## 2017-09-12 DIAGNOSIS — Z3480 Encounter for supervision of other normal pregnancy, unspecified trimester: Secondary | ICD-10-CM

## 2017-09-12 DIAGNOSIS — Z3483 Encounter for supervision of other normal pregnancy, third trimester: Secondary | ICD-10-CM

## 2017-09-12 MED ORDER — PREDNISONE 10 MG (21) PO TBPK: ORAL_TABLET | ORAL | 0 refills | Status: DC

## 2017-09-12 NOTE — Progress Notes (Signed)
Prenatal Visit Note Date: 09/12/2017 Clinic: [facility]  Subjective:  Brenda Gutierrez is a 32 y.o. G2P1001 at [redacted]w[redacted]d being seen today for ongoing prenatal care.  She is currently monitored for the following issues for this low-risk pregnancy and has Common migraine with intractable migraine; Obesity in pregnancy; Supervision of other normal pregnancy, antepartum; Previous cesarean delivery for NRFHT in 2017; and Right hip pain on their problem list.  Patient reports still complaining of right hip pain. see below.   Contractions: Not present. Vag. Bleeding: None.  Movement: Present. Denies leaking of fluid.   The following portions of the patient's history were reviewed and updated as appropriate: allergies, current medications, past family history, past medical history, past social history, past surgical history and problem list. Problem list updated.  Objective:   Vitals:   09/12/17 0958  BP: 138/80  Pulse: (!) 114  Weight: 255 lb (115.7 kg)    Fetal Status: Fetal Heart Rate (bpm): 140   Movement: Present     General:  Alert, oriented and cooperative. Patient is in no acute distress.  Skin: Skin is warm and dry. No rash noted.   Cardiovascular: Normal heart rate noted  Respiratory: Normal respiratory effort, no problems with respiration noted  Abdomen: Soft, gravid, appropriate for gestational age. Pain/Pressure: Present     Pelvic:  Cervical exam deferred        Extremities: Normal range of motion.  Edema: None  Mental Status: Normal mood and affect. Normal behavior. Normal judgment and thought content.   Urinalysis:      Assessment and Plan:  Pregnancy: G2P1001 at [redacted]w[redacted]d  1. Right hip pain Has been to chiropractor and no real relief. Will try a referral to PMR and will do one refill only of steroids in the interim.  - Ambulatory referral to Physical Medicine Rehab  2. Supervision of other normal pregnancy, antepartum - CBC - HIV antibody (with  reflex) - RPR - Glucose Tolerance, 2 Hours w/1 Hour  Preterm labor symptoms and general obstetric precautions including but not limited to vaginal bleeding, contractions, leaking of fluid and fetal movement were reviewed in detail with the patient. Please refer to After Visit Summary for other counseling recommendations.  Return in about 2 weeks (around 09/26/2017) for rob.   Orwell Bing, MD

## 2017-09-13 LAB — CBC
HEMATOCRIT: 37.5 % (ref 34.0–46.6)
Hemoglobin: 11.8 g/dL (ref 11.1–15.9)
MCH: 27.5 pg (ref 26.6–33.0)
MCHC: 31.5 g/dL (ref 31.5–35.7)
MCV: 87 fL (ref 79–97)
PLATELETS: 295 10*3/uL (ref 150–450)
RBC: 4.29 x10E6/uL (ref 3.77–5.28)
RDW: 14 % (ref 12.3–15.4)
WBC: 14 10*3/uL — AB (ref 3.4–10.8)

## 2017-09-13 LAB — RPR: RPR: NONREACTIVE

## 2017-09-13 LAB — GLUCOSE TOLERANCE, 2 HOURS W/ 1HR
GLUCOSE, 1 HOUR: 127 mg/dL (ref 65–179)
GLUCOSE, 2 HOUR: 105 mg/dL (ref 65–152)
GLUCOSE, FASTING: 87 mg/dL (ref 65–91)

## 2017-09-13 LAB — HIV ANTIBODY (ROUTINE TESTING W REFLEX): HIV SCREEN 4TH GENERATION: NONREACTIVE

## 2017-09-18 ENCOUNTER — Telehealth: Payer: Self-pay

## 2017-09-18 DIAGNOSIS — M25551 Pain in right hip: Secondary | ICD-10-CM

## 2017-09-18 DIAGNOSIS — M25552 Pain in left hip: Principal | ICD-10-CM

## 2017-09-18 NOTE — Telephone Encounter (Signed)
Received call from patient concerning the referral that was place for her to see PT for pain doing pregnancy. Patient stated the office will not be able to see her for another four weeks. She stated that is too long to wait. I have placed another referral for her to go pelvic PT [facility]. The office will call her today to get her schedule.

## 2017-09-19 ENCOUNTER — Ambulatory Visit: Payer: 59 | Attending: Obstetrics and Gynecology | Admitting: Physical Therapy

## 2017-09-19 DIAGNOSIS — R2689 Other abnormalities of gait and mobility: Secondary | ICD-10-CM | POA: Diagnosis present

## 2017-09-19 DIAGNOSIS — M533 Sacrococcygeal disorders, not elsewhere classified: Secondary | ICD-10-CM | POA: Diagnosis not present
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
## 2017-09-19 NOTE — Patient Instructions (Addendum)
Modifications to Cardio Fit positions    Lunge short length, unlocked knees, 50% weight on both legs/  4 corners of foot down no toe gripping    Squat, 4 corners of foot down -no toe gripping  unlocked knees on rise    Warrior II   Shift pelvic 4 corners of foot down -no toe gripping  unlocked knees on rise and feet/ knees alignment to shine in same directions   ________  Stretch to R hip Seated gentle twist with thighs crossed over  5 breaths    ----  Proper body mechanics with getting out of a chair to decrease strain  on back &pelvic floor   Avoid holding your breath when Getting out of the chair:  Scoot to front part of chair chair Heels behind feet, feet are hip width apart, nose over toes  Inhale like you are smelling roses Exhale to stand

## 2017-09-22 ENCOUNTER — Other Ambulatory Visit (HOSPITAL_COMMUNITY): Payer: Self-pay | Admitting: Obstetrics and Gynecology

## 2017-09-22 ENCOUNTER — Ambulatory Visit (HOSPITAL_COMMUNITY)
Admission: RE | Admit: 2017-09-22 | Discharge: 2017-09-22 | Disposition: A | Discharge status: Home / Self Care | Payer: 59 | Source: Ambulatory Visit | Attending: Family Medicine | Admitting: Family Medicine

## 2017-09-22 ENCOUNTER — Ambulatory Visit: Payer: Commercial Managed Care - HMO | Attending: Obstetrics and Gynecology | Admitting: Physical Therapy

## 2017-09-22 ENCOUNTER — Encounter (HOSPITAL_COMMUNITY): Payer: Self-pay

## 2017-09-22 DIAGNOSIS — O34219 Maternal care for unspecified type scar from previous cesarean delivery: Secondary | ICD-10-CM

## 2017-09-22 DIAGNOSIS — Z362 Encounter for other antenatal screening follow-up: Secondary | ICD-10-CM | POA: Diagnosis not present

## 2017-09-22 DIAGNOSIS — M533 Sacrococcygeal disorders, not elsewhere classified: Secondary | ICD-10-CM | POA: Diagnosis present

## 2017-09-22 DIAGNOSIS — R2689 Other abnormalities of gait and mobility: Secondary | ICD-10-CM | POA: Insufficient documentation

## 2017-09-22 DIAGNOSIS — Z3A29 29 weeks gestation of pregnancy: Secondary | ICD-10-CM | POA: Diagnosis not present

## 2017-09-22 DIAGNOSIS — O99213 Obesity complicating pregnancy, third trimester: Secondary | ICD-10-CM

## 2017-09-22 NOTE — Addendum Note (Signed)
Addended by: Mariane MastersYEUNG, SHIN-YIING on: 09/22/2017 01:03 PM   Modules accepted: Orders

## 2017-09-22 NOTE — Therapy (Signed)
[place] [facility] MAIN [facility] SERVICES [address] [place], [place], [address] Phone: [phone]   Fax:  [phone]  Physical Therapy Treatment  Patient Details  Name: Brenda Gutierrez MRN: [id number] Date of Birth: Mar 28, 1986 No data recorded  Encounter Date: 09/22/2017  PT End of Session - 09/22/17 2320    Visit Number  2    Number of Visits  12    PT Start Time  1305    PT Stop Time  1404    PT Time Calculation (min)  59 min    Equipment Utilized During Treatment  Gait belt    Activity Tolerance  Patient tolerated treatment well       Past Medical History:  Diagnosis Date  . Headache    migraines    Past Surgical History:  Procedure Laterality Date  . CESAREAN SECTION N/A 07/21/2015   Procedure: CESAREAN SECTION;  Surgeon: Hildred Laser, MD;  Location: [facility] ORS;  Service: Obstetrics;  Laterality: N/A;  . OVARIAN CYST REMOVAL Left   . TONSILLECTOMY    . TUMOR REMOVAL  2003   throat-benign  . TUMOR REMOVAL  2005   right breast-benign    There were no vitals filed for this visit.  Subjective Assessment - 09/22/17 2308    Subjective  Pt reported she felt better after last session but felt stiff in her back again the following day. 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 Pt reports she performs 500 squats in her class.  Pt has decreased the number of classess she teaches from 5-6 to 4 classes per week.    Pertinent History  Denied falls, ankle sprains, injuries, low back pain.  Leakage only with a hard cough , once in a blue moon.  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 [employer] which involves lots of arm movements with drum sticks,  transfers to the floor, lunges, lots of squats      Patient Stated Goals  no pain         [facility] PT Assessment - 09/22/17 2310      Functional Tests   Functional tests  -- simulated yoga stretches in standing,limited pelviclengthen      Squat   Comments  hyperextension of knees on rise of squat, limited lengthening of pelvic floor      Palpation   SI assessment   B PSIS more symmetrical    Palpation comment  SIJ with proper mobility                 Pelvic Floor Special Questions - 09/22/17 2311    External Palpation  severe tenderness/ tightness over ischial tuberosity/ tubercle mm attachments, B         [facility] Adult PT Treatment/Exercise - 09/22/17 2313      Therapeutic Activites    Therapeutic Activities  -- see pt instructions      Neuro Re-ed    Neuro Re-ed Details   see pt instructions       Manual Therapy   Manual therapy comments  MWM and STM along probelm areas              PT Education - 09/22/17  2320    Education Details  HEP    Person(s) Educated  Patient    Methods  Explanation;Demonstration;Tactile cues;Verbal cues;Handout    Comprehension  Verbalized understanding;Returned demonstration;Verbal cues required;Tactile cues required          PT Long Term Goals - 09/22/17 1253      PT LONG TERM GOAL #1   Title  Pt will decrease PGQ % to < % in order to return ADLs    Time  12    Period  Weeks    Status  New    Target Date  12/15/17      PT LONG TERM GOAL #2   Title  Pt will demo proper alignment and propioception of lower kinetic chain B with simulated fitness exercises in order to minimize injuries    Time  4    Period  Weeks    Status  New    Target Date  10/20/17      PT LONG TERM GOAL #3   Title  Pt will demo proper SIJ mobility across 2 visits in order to minimize pain and progress with HEP    Time  2    Period  Weeks    Status  New    Target Date  10/06/17            Plan - 09/22/17 2320    Clinical Impression  Statement  Pt showed signficant mm tightness at attachment sites of obturator tubercle and ischial tuberosity bilaterally while she showed good carry over with SIJ mobility  from last session.  Pt tolerated manual Tx without complaints and demo'd proper form with squat modifications. Advised pt to not perform 500 reps per class and to minimize the number she performs and perform new modification to squat to minimize pelvic floor tightness. Pt demo'd correctly. The stretches she performs in her fitness class were also modified to optimize pelvic floor lengthening. Pt reported her pain decreased from 7/10 to 5/10 post Tx. Pt's gait was less antalgic with longer stride length post Tx. Pt was provided a SIJ belt and pt demo'd IND with donning it. Pt continues to benefit from skilled PT.    Rehab Potential  Good    PT Frequency  2x / week    PT Duration  12 weeks    PT Treatment/Interventions  Moist Heat;Stair training;Neuromuscular re-education;Functional mobility training;Therapeutic activities;Therapeutic exercise;Balance training;Patient/family education;Manual techniques;ADLs/Self Care Home Management;Energy conservation    Consulted and Agree with Plan of Care  Patient       Patient will benefit from skilled therapeutic intervention in order to improve the following deficits and impairments:  Abnormal gait, Improper body mechanics, Pain, Decreased mobility, Increased muscle spasms, Postural dysfunction, Decreased coordination, Decreased endurance, Decreased activity tolerance, Difficulty walking, Decreased strength, Hypermobility  Visit Diagnosis: Sacrococcygeal disorders, not elsewhere classified  Other abnormalities of gait and mobility     Problem List Patient Active Problem List   Diagnosis Date Noted  . Right hip pain 08/04/2017  . Supervision of other normal pregnancy, antepartum 05/13/2017  . Previous cesarean delivery for NRFHT in 2017 05/13/2017  . Obesity in pregnancy 01/20/2015   . Common migraine with intractable migraine 01/04/2014    Mariane MastersYeung,Shin Yiing ,PT, DPT, E-RYT  09/22/2017, 11:29 PM  [facility]IN [facility] [address] [place], [place], [address] Phone: [phone]   Fax:  [phone]  Name: Lavonna RuaBrandy B Malter MRN: [id number] Date of Birth: 02/07/1986

## 2017-09-22 NOTE — Patient Instructions (Signed)
Class modifications:  do not do 500 squats in a class   Use this version: "scoot the buttock" with firm feet "fan the fanny" peacock feathers  Images    ___   Modified stretches at home and after the class :  ( also do the R side more reps at home)    triangle with foot on block, opposite hand on shoulder, elbow rolling backwards  10    forward fold with foot on block, and leaning to the side of block, "hug a ball"  Stabilizing feet   10   ____  sidelying stretching,  Bottom knee bent and straighten, top knee bent with foot on the bed open and close

## 2017-09-29 ENCOUNTER — Ambulatory Visit (INDEPENDENT_AMBULATORY_CARE_PROVIDER_SITE_OTHER): Payer: 59 | Admitting: Family Medicine

## 2017-09-29 VITALS — BP 115/79 | HR 92 | Wt 260.4 lb

## 2017-09-29 DIAGNOSIS — Z3483 Encounter for supervision of other normal pregnancy, third trimester: Secondary | ICD-10-CM

## 2017-09-29 DIAGNOSIS — O9989 Other specified diseases and conditions complicating pregnancy, childbirth and the puerperium: Secondary | ICD-10-CM

## 2017-09-29 DIAGNOSIS — O34219 Maternal care for unspecified type scar from previous cesarean delivery: Secondary | ICD-10-CM

## 2017-09-29 DIAGNOSIS — O99891 Other specified diseases and conditions complicating pregnancy: Secondary | ICD-10-CM

## 2017-09-29 DIAGNOSIS — M549 Dorsalgia, unspecified: Secondary | ICD-10-CM

## 2017-09-29 DIAGNOSIS — Z3480 Encounter for supervision of other normal pregnancy, unspecified trimester: Secondary | ICD-10-CM

## 2017-09-29 DIAGNOSIS — B354 Tinea corporis: Secondary | ICD-10-CM

## 2017-09-29 MED ORDER — TERCONAZOLE 0.8 % VA CREA: 1.0000 | TOPICAL_CREAM | Freq: Every day | VAGINAL | 0 refills | Status: DC

## 2017-09-29 MED ORDER — TENS THERAPY PAIN RELIEF DEVI: 1.0000 [IU] | Freq: Two times a day (BID) | 0 refills | Status: DC

## 2017-09-29 NOTE — Progress Notes (Signed)
   PRENATAL VISIT NOTE  Subjective:  Brenda Gutierrez is a 32 y.o. G2P1001 at 7910w3d being seen today for ongoing prenatal care.  She is currently monitored for the following issues for this low-risk pregnancy and has Common migraine with intractable migraine; Obesity in pregnancy; Supervision of other normal pregnancy, antepartum; Previous cesarean delivery for NRFHT in 2017; and Right hip pain on their problem list.  Patient reports backache.  Contractions: Not present.  .  Movement: Present. Denies leaking of fluid.   The following portions of the patient's history were reviewed and updated as appropriate: allergies, current medications, past family history, past medical history, past social history, past surgical history and problem list. Problem list updated.  Objective:   Vitals:   09/29/17 1501  BP: 115/79  Pulse: 92  Weight: 260 lb 6.4 oz (118.1 kg)    Fetal Status: Fetal Heart Rate (bpm): 130 Fundal Height: 29 cm Movement: Present     General:  Alert, oriented and cooperative. Patient is in no acute distress.  Skin: Skin is warm and dry. No rash noted.   Cardiovascular: Normal heart rate noted  Respiratory: Normal respiratory effort, no problems with respiration noted  Abdomen: Soft, gravid, appropriate for gestational age.  Pain/Pressure: Present     Pelvic: Cervical exam deferred      has yeast infection noted on anterior perineum  Extremities: Normal range of motion.  Edema: None  Mental Status: Normal mood and affect. Normal behavior. Normal judgment and thought content.   Assessment and Plan:  Pregnancy: G2P1001 at 9910w3d  1. Previous cesarean delivery for NRFHT in 2017 Desires TOLAC--birth plan reviewed today and scanned to chart  2. Supervision of other normal pregnancy, antepartum   3. Back pain affecting pregnancy in third trimester Trial of TENS unit Continue flexeril, heat, ice, PT, tylenol for pain - Nerve Stimulator (TENS THERAPY PAIN RELIEF) DEVI; 1  Units by Does not apply route 2 (two) times daily.  Dispense: 1 Device; Refill: 0  4. Tinea corporis - terconazole (TERAZOL 3) 0.8 % vaginal cream; Place 1 applicator vaginally at bedtime.  Dispense: 20 g; Refill: 0  Preterm labor symptoms and general obstetric precautions including but not limited to vaginal bleeding, contractions, leaking of fluid and fetal movement were reviewed in detail with the patient. Please refer to After Visit Summary for other counseling recommendations.  Return in 2 weeks (on 10/13/2017).  Future Appointments  Date Time Provider Department Center  10/02/2017 12:00 PM Mariane MastersYeung, Shin-Yiing, PT [facility] None  10/13/2017  3:15 PM Anyanwu, Jethro BastosUgonna A, MD [facility]    Reva Boresanya S Roi Jafari, MD

## 2017-09-29 NOTE — Patient Instructions (Signed)

## 2017-09-30 ENCOUNTER — Encounter: Payer: Self-pay | Admitting: *Deleted

## 2017-10-02 ENCOUNTER — Ambulatory Visit: Payer: Commercial Managed Care - HMO | Admitting: Physical Therapy

## 2017-10-02 DIAGNOSIS — M533 Sacrococcygeal disorders, not elsewhere classified: Secondary | ICD-10-CM

## 2017-10-02 DIAGNOSIS — R2689 Other abnormalities of gait and mobility: Secondary | ICD-10-CM

## 2017-10-02 NOTE — Patient Instructions (Signed)
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   L sidelying 1) let the R thigh drop onto bed Breath 5 breaths  2) foam roller under R thigh and inside shin , strap is placed under ballmounds, R hand holding strap, elbow bents and swinging back as foot swings back, knee straightens, Repeat, knee to chest)  10 reps  3) quad stretch with the strap , and pull foot and knee back past  Midline  10 reps    __________  Ankle strengthening to improve arch   Blue band under ballmound  30 reps pressing down on "gas pedal"  30 reps pinky up and out   X 3  ___________ Wear SIJ belt more often throughout the day   ___________  Exercises in the pool:  Loosening up with leg ankle circles Lengthen the back  Hamstrings   Walking forward with noodle ( chest presses), remember lower ballmound then heels  Forward and back ward   Sidestepping with noodle press behind you for latissmiss strengthening Both directions   Stretches afterwards  And relaxation . Cool down  20--30 min

## 2017-10-02 NOTE — Therapy (Signed)
[facility] MAIN [facility] SERVICES [address] [place], [place], [address] Phone: [phone]   Fax:  [phone]  Physical Therapy Treatment  Patient Details  Name: Brenda Gutierrez MRN: [id number] Date of Birth: [birth year] No data recorded  Encounter Date: 10/02/2017  PT End of Session - 10/03/17 1513    Visit Number  3    Number of Visits  12    PT Start Time  1200    PT Stop Time  1305    PT Time Calculation (min)  65 min    Equipment Utilized During Treatment  Gait belt    Activity Tolerance  Patient tolerated treatment well       Past Medical History:  Diagnosis Date  . Headache    migraines    Past Surgical History:  Procedure Laterality Date  . CESAREAN SECTION N/A 07/21/2015   Procedure: CESAREAN SECTION;  Surgeon: Hildred Laser, MD;  Location: [facility] ORS;  Service: Obstetrics;  Laterality: N/A;  . OVARIAN CYST REMOVAL Left   . TONSILLECTOMY    . TUMOR REMOVAL  2003   throat-benign  . TUMOR REMOVAL  2005   right breast-benign    There were no vitals filed for this visit.  Subjective Assessment - 10/02/17 1209    Subjective  Pt reported no pain for 4 days after last session. 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 She is not having as much the back pain.  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 On her rest days, pt felt pain return. Yesterday, pt felt shooting pain at the same intensity 8-9/10.  She notices she has more contractions on the day she is pain. Pain subsides after sitting for 5-10 minutes.         Pertinent History  Denied falls, ankle sprains, injuries, low back pain.  Leakage only with a hard cough , once in a blue moon.  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 [employer] which involves lots of arm movements with drum sticks, transfers to the floor, lunges, lots of squats      Patient Stated Goals  no pain         [facility] PT Assessment - 10/03/17 1516      Palpation   Palpation comment  slight hypombility/ tenderness at R SIJ, tightness in hamstrings, quad ( pre Tx: limited hip ext R, post Tx: increased)                    [facility] Adult PT Treatment/Exercise - 10/03/17 1515      Exercises   Exercises  -- see pt instructions                  PT Long Term Goals - 09/22/17 1253      PT LONG TERM GOAL #1   Title  Pt will decrease PGQ % to < % in order to return ADLs    Time  12    Period  Weeks    Status  New    Target Date  12/15/17      PT LONG TERM GOAL #2   Title  Pt will demo proper alignment and propioception of  lower kinetic chain B with simulated fitness exercises in order to minimize injuries    Time  4    Period  Weeks    Status  New    Target Date  10/20/17      PT LONG TERM GOAL #3   Title  Pt will demo proper SIJ mobility across 2 visits in order to minimize pain and progress with HEP    Time  2    Period  Weeks    Status  New    Target Date  10/06/17            Plan - 10/03/17 1513    Clinical Impression Statement  Pt made excellent progress with last Tx and exercise modification as she was painfree for 4 days. Today pt showed slightly hypombile R SIJ which improved with stretches to increased hamstring/ quad flexibility as these mm groups were tight. 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 Pt continues to benefit from skilled PT. Plan d/c at next session    Rehab Potential  Good    PT Frequency  2x / week    PT Duration  12 weeks    PT Treatment/Interventions  Moist Heat;Stair training;Neuromuscular re-education;Functional mobility training;Therapeutic activities;Therapeutic  exercise;Balance training;Patient/family education;Manual techniques;ADLs/Self Care Home Management;Energy conservation    Consulted and Agree with Plan of Care  Patient       Patient will benefit from skilled therapeutic intervention in order to improve the following deficits and impairments:  Abnormal gait, Improper body mechanics, Pain, Decreased mobility, Increased muscle spasms, Postural dysfunction, Decreased coordination, Decreased endurance, Decreased activity tolerance, Difficulty walking, Decreased strength, Hypermobility  Visit Diagnosis: Sacrococcygeal disorders, not elsewhere classified  Other abnormalities of gait and mobility     Problem List Patient Active Problem List   Diagnosis Date Noted  . Right hip pain 08/04/2017  . Supervision of other normal pregnancy, antepartum 05/13/2017  . Previous cesarean delivery for NRFHT in 2017 05/13/2017  . Obesity in pregnancy 01/20/2015  . Common migraine with intractable migraine 01/04/2014    Mariane MastersYeung,Shin Yiing ,PT, DPT, E-RYT  10/03/2017, 3:16 PM  [place] [facility] MAIN [facility] [address] [place], [place], [address] Phone: [phone]   Fax:  [phone]  Name: Brenda Gutierrez MRN: [id number] Date of Birth: Oct 21, 1985

## 2017-10-12 ENCOUNTER — Other Ambulatory Visit: Payer: Self-pay

## 2017-10-12 ENCOUNTER — Encounter: Payer: Self-pay | Admitting: *Deleted

## 2017-10-12 ENCOUNTER — Observation Stay
Admission: EM | Admit: 2017-10-12 | Discharge: 2017-10-12 | Disposition: A | Discharge status: Home / Self Care | Payer: 59 | Attending: Obstetrics and Gynecology | Admitting: Obstetrics and Gynecology

## 2017-10-12 DIAGNOSIS — O26893 Other specified pregnancy related conditions, third trimester: Principal | ICD-10-CM | POA: Insufficient documentation

## 2017-10-12 DIAGNOSIS — N858 Other specified noninflammatory disorders of uterus: Secondary | ICD-10-CM | POA: Diagnosis not present

## 2017-10-12 DIAGNOSIS — Z3A32 32 weeks gestation of pregnancy: Secondary | ICD-10-CM | POA: Insufficient documentation

## 2017-10-12 DIAGNOSIS — Z3480 Encounter for supervision of other normal pregnancy, unspecified trimester: Secondary | ICD-10-CM

## 2017-10-12 LAB — CHLAMYDIA/NGC RT PCR (ARMC ONLY)
Chlamydia Tr: NOT DETECTED
N gonorrhoeae: NOT DETECTED

## 2017-10-12 LAB — KLEIHAUER-BETKE STAIN
Fetal Cells %: 0 %
Quantitation Fetal Hemoglobin: 0 mL

## 2017-10-12 MED ORDER — TERBUTALINE SULFATE 1 MG/ML IJ SOLN
0.2500 mg | Freq: Once | INTRAMUSCULAR | Status: AC
Start: 2017-10-12 — End: 2017-10-12
  Administered 2017-10-12: 0.25 mg via SUBCUTANEOUS
  Filled 2017-10-12: qty 1

## 2017-10-12 MED ORDER — NIFEDIPINE 10 MG PO CAPS
10.0000 mg | ORAL_CAPSULE | ORAL | Status: AC
  Administered 2017-10-12 (×4): 10 mg via ORAL
  Filled 2017-10-12 (×3): qty 1

## 2017-10-12 MED ORDER — NIFEDIPINE 20 MG PO CAPS: 20.0000 mg | ORAL_CAPSULE | Freq: Three times a day (TID) | ORAL | 0 refills | Status: DC

## 2017-10-12 MED ORDER — NIFEDIPINE 10 MG PO CAPS
ORAL_CAPSULE | ORAL | Status: AC
Start: 2017-10-12 — End: 2017-10-12
  Administered 2017-10-12: 10 mg via ORAL
  Filled 2017-10-12: qty 1

## 2017-10-12 MED ORDER — ACETAMINOPHEN 325 MG PO TABS: 650.0000 mg | ORAL_TABLET | ORAL | Status: DC | PRN

## 2017-10-12 NOTE — OB Triage Note (Signed)
CO ctx off and on since Friday. Reported spotting Friday but none since then. Denies LOF. Reports good fetal movement. CO increase in vaginal discharge. Denies odor, brownish in color. Also CO HA since last night. Has not taken any medication for HA. Also co return of morning sickness today. Brenda Gutierrez, Brenda Gutierrez

## 2017-10-12 NOTE — Discharge Instructions (Signed)
Braxton Hicks Contractions °Contractions of the uterus can occur throughout pregnancy, but they are not always a sign that you are in labor. You may have practice contractions called Braxton Hicks contractions. These false labor contractions are sometimes confused with true labor. °What are Braxton Hicks contractions? °Braxton Hicks contractions are tightening movements that occur in the muscles of the uterus before labor. Unlike true labor contractions, these contractions do not result in opening (dilation) and thinning of the cervix. Toward the end of pregnancy (32-34 weeks), Braxton Hicks contractions can happen more often and may become stronger. These contractions are sometimes difficult to tell apart from true labor because they can be very uncomfortable. You should not feel embarrassed if you go to the hospital with false labor. °Sometimes, the only way to tell if you are in true labor is for your health care provider to look for changes in the cervix. The health care provider will do a physical exam and may monitor your contractions. If you are not in true labor, the exam should show that your cervix is not dilating and your water has not broken. °If there are other health problems associated with your pregnancy, it is completely safe for you to be sent home with false labor. You may continue to have Braxton Hicks contractions until you go into true labor. °How to tell the difference between true labor and false labor °True labor °· Contractions last 30-70 seconds. °· Contractions become very regular. °· Discomfort is usually felt in the top of the uterus, and it spreads to the lower abdomen and low back. °· Contractions do not go away with walking. °· Contractions usually become more intense and increase in frequency. °· The cervix dilates and gets thinner. °False labor °· Contractions are usually shorter and not as strong as true labor contractions. °· Contractions are usually irregular. °· Contractions  are often felt in the front of the lower abdomen and in the groin. °· Contractions may go away when you walk around or change positions while lying down. °· Contractions get weaker and are shorter-lasting as time goes on. °· The cervix usually does not dilate or become thin. °Follow these instructions at home: °· Take over-the-counter and prescription medicines only as told by your health care provider. °· Keep up with your usual exercises and follow other instructions from your health care provider. °· Eat and drink lightly if you think you are going into labor. °· If Braxton Hicks contractions are making you uncomfortable: °? Change your position from lying down or resting to walking, or change from walking to resting. °? Sit and rest in a tub of warm water. °? Drink enough fluid to keep your urine pale yellow. Dehydration may cause these contractions. °? Do slow and deep breathing several times an hour. °· Keep all follow-up prenatal visits as told by your health care provider. This is important. °Contact a health care provider if: °· You have a fever. °· You have continuous pain in your abdomen. °Get help right away if: °· Your contractions become stronger, more regular, and closer together. °· You have fluid leaking or gushing from your vagina. °· You pass blood-tinged mucus (bloody show). °· You have bleeding from your vagina. °· You have low back pain that you never had before. °· You feel your baby’s head pushing down and causing pelvic pressure. °· Your baby is not moving inside you as much as it used to. °Summary °· Contractions that occur before labor are called Braxton   Hicks contractions, false labor, or practice contractions. °· Braxton Hicks contractions are usually shorter, weaker, farther apart, and less regular than true labor contractions. True labor contractions usually become progressively stronger and regular and they become more frequent. °· Manage discomfort from Braxton Hicks contractions by  changing position, resting in a warm bath, drinking plenty of water, or practicing deep breathing. °This information is not intended to replace advice given to you by your health care provider. Make sure you discuss any questions you have with your health care provider. °Document Released: 08/22/2016 Document Revised: 08/22/2016 Document Reviewed: 08/22/2016 °Elsevier Interactive Patient Education © 2018 [employer] ° °

## 2017-10-12 NOTE — Final Progress Note (Addendum)
Physician Final Progress Note  Patient ID: Brenda RuaBrandy B Isaacks MRN: [id number] DOB/AGE: 12/11/1985 32 y.o.  Admit date: 10/12/2017 Admitting provider: Vena AustriaAndreas Cuauhtemoc Huegel, MD Discharge date: 10/12/2017   Admission Diagnoses: Contractions, vaginal bleeding  Discharge Diagnoses:  Preterm contractions Cervicitis  32 y.o. G2P1001 at 2511w2d presenting with vaginal bleeding that initially started two days ago, light.  Still some increased vaginal discharge browning tint.  Also noted increased cramping.  Had problems with preterm contractions last pregnancy.  No recent intercourse, abdominal trauma. Had had some nausea return in the third trimester.  +FM, no LOF.  Evaluation included normal GC/CT, normal wet mount.  Exam revealed a closed cervix no actually bleeding.  However, on collecting GC/CT swab contact bleeding was elicited from the cervix.  KB was sent and returned negative for fetal cells.  She was treated with nifedipine and terbutaline.  We discussed that while nifedipine would not prevent preterm labor it may be beneficial in treating symptomatic preterm contractions.  I discussed that I generally continue this until 34 weeks.  She has follow up with her primary OB/GYN tomorrow.    The patient is Rh positive rhogam was therefore not indicated.    Consults: None  Significant Findings/ Diagnostic Studies:  Results for orders placed or performed during the hospital encounter of 10/12/17 (from the past 24 hour(s))  Kleihauer-Betke stain     Status: None   Collection Time: 10/12/17 11:18 AM  Result Value Ref Range   Fetal Cells % 0 %   Quantitation Fetal Hemoglobin 0.0000 mL   # Vials RhIg NOT INDICATED   Chlamydia/NGC rt PCR ([facility] only)     Status: None   Collection Time: 10/12/17 11:42 AM  Result Value Ref Range   Specimen source GC/Chlam ENDOCERVICAL    Chlamydia Tr NOT DETECTED NOT DETECTED   N gonorrhoeae NOT DETECTED NOT DETECTED   Wet Prep: Clue Cells: Negative Fungal  elements: Negative Trichomonas: Negative  Procedures:  Baseline: 135 Variability: moderate Accelerations: present Decelerations: absent Tocometry: at one point every 2-4 minutes but responded to tocolysis  The patient was monitored for 30 minutes, fetal heart rate tracing was deemed reactive, category I tracing,  Discharge Condition: good  Disposition: Discharge disposition: 01-Home or Self Care       Diet: Regular diet  Discharge Activity: Activity as tolerated  Discharge Instructions    Discharge activity:  No Restrictions   Complete by:  As directed    Discharge diet:  No restrictions   Complete by:  As directed    No sexual activity restrictions   Complete by:  As directed    Notify physician for a general feeling that "something is not right"   Complete by:  As directed    Notify physician for increase or change in vaginal discharge   Complete by:  As directed    Notify physician for intestinal cramps, with or without diarrhea, sometimes described as "gas pain"   Complete by:  As directed    Notify physician for leaking of fluid   Complete by:  As directed    Notify physician for low, dull backache, unrelieved by heat or Tylenol   Complete by:  As directed    Notify physician for menstrual like cramps   Complete by:  As directed    Notify physician for pelvic pressure   Complete by:  As directed    Notify physician for uterine contractions.  These may be painless and feel like the uterus is tightening or the  baby is  "balling up"   Complete by:  As directed    Notify physician for vaginal bleeding   Complete by:  As directed    PRETERM LABOR:  Includes any of the follwing symptoms that occur between 20 - [redacted] weeks gestation.  If these symptoms are not stopped, preterm labor can result in preterm delivery, placing your baby at risk   Complete by:  As directed      Allergies as of 10/12/2017      Reactions   Bee Pollen Anaphylaxis   Cherry Flavor [flavoring  Agent] Anaphylaxis   Insect Extract Allergy Skin Test Anaphylaxis   Penicillin G Anaphylaxis   Penicillins Anaphylaxis   Adhesive [tape]    Amoxicillin-pot Clavulanate Hives   Red Dye    Latex Rash      Medication List    TAKE these medications   acetaminophen 500 MG tablet Commonly known as:  TYLENOL Take 1,000 mg by mouth every 6 (six) hours as needed for mild pain.   FLEXERIL PO Take by mouth.   multivitamin-prenatal 27-0.8 MG Tabs tablet Take 1 tablet by mouth daily at 12 noon.   NIFEdipine 20 MG capsule Commonly known as:  PROCARDIA Take 1 capsule (20 mg total) by mouth 3 (three) times daily for 14 days.   predniSONE 10 MG (21) Tbpk tablet Commonly known as:  STERAPRED UNI-PAK 21 TAB Per packet instructions   TENS THERAPY PAIN RELIEF Devi 1 Units by Does not apply route 2 (two) times daily.   terconazole 0.8 % vaginal cream Commonly known as:  TERAZOL 3 Place 1 applicator vaginally at bedtime.        Total time spent taking care of this patient: 60 minutes  Signed: Vena Austria 10/12/2017, 3:16 PM

## 2017-10-13 ENCOUNTER — Ambulatory Visit (INDEPENDENT_AMBULATORY_CARE_PROVIDER_SITE_OTHER): Payer: 59 | Admitting: Obstetrics & Gynecology

## 2017-10-13 VITALS — BP 122/80 | HR 108 | Wt 261.0 lb

## 2017-10-13 DIAGNOSIS — Z3480 Encounter for supervision of other normal pregnancy, unspecified trimester: Secondary | ICD-10-CM

## 2017-10-13 NOTE — Discharge Summary (Signed)
See final progress note. 

## 2017-10-13 NOTE — Progress Notes (Signed)
   PRENATAL VISIT NOTE  Subjective:  Brenda Gutierrez is a 32 y.o. G2P1001 at 2223w3d being seen today for ongoing prenatal care.  She is currently monitored for the following issues for this low-risk pregnancy and has Common migraine with intractable migraine; Obesity in pregnancy; Supervision of other normal pregnancy, antepartum; Previous cesarean delivery for NRFHT in 2017; and Right hip pain on their problem list.  Patient reports having some spotting and irregular contractions, [facility].   Contractions: Irregular. Vag. Bleeding: Scant.  Movement: Present. Denies leaking of fluid.   The following portions of the patient's history were reviewed and updated as appropriate: allergies, current medications, past family history, past medical history, past social history, past surgical history and problem list. Problem list updated.  Objective:   Vitals:   10/13/17 1524  BP: 122/80  Pulse: (!) 108  Weight: 261 lb (118.4 kg)    Fetal Status: Fetal Heart Rate (bpm): 145 Fundal Height: 32 cm Movement: Present     General:  Alert, oriented and cooperative. Patient is in no acute distress.  Skin: Skin is warm and dry. No rash noted.   Cardiovascular: Normal heart rate noted  Respiratory: Normal respiratory effort, no problems with respiration noted  Abdomen: Soft, gravid, appropriate for gestational age.  Pain/Pressure: Present     Pelvic: Cervical exam deferred        Extremities: Normal range of motion.  Edema: None  Mental Status: Normal mood and affect. Normal behavior. Normal judgment and thought content.   Assessment and Plan:  Pregnancy: G2P1001 at 8223w3d  1. Supervision of other normal pregnancy, antepartum Preterm labor symptoms and general obstetric precautions including but not limited to vaginal bleeding, contractions, leaking of fluid and fetal movement were reviewed in detail with the patient. Please refer to After Visit Summary for other  counseling recommendations.  Return in about 2 weeks (around 10/27/2017) for OB Visit.  Jaynie CollinsUgonna Arienne Gartin, MD

## 2017-10-13 NOTE — Patient Instructions (Signed)
Return to clinic for any scheduled appointments or obstetric concerns, or go to [facility] for evaluation  

## 2017-10-21 ENCOUNTER — Ambulatory Visit (INDEPENDENT_AMBULATORY_CARE_PROVIDER_SITE_OTHER): Payer: 59 | Admitting: Obstetrics & Gynecology

## 2017-10-21 VITALS — BP 121/75 | HR 100 | Wt 263.0 lb

## 2017-10-21 DIAGNOSIS — Z3480 Encounter for supervision of other normal pregnancy, unspecified trimester: Secondary | ICD-10-CM

## 2017-10-21 DIAGNOSIS — O4703 False labor before 37 completed weeks of gestation, third trimester: Secondary | ICD-10-CM

## 2017-10-21 NOTE — Patient Instructions (Signed)

## 2017-10-21 NOTE — Progress Notes (Signed)
   PRENATAL VISIT NOTE  Subjective:  Brenda Gutierrez is a 32 y.o. G2P1001 at 5059w4d being seen today for ongoing prenatal care.  She is currently monitored for the following issues for this low-risk pregnancy and has Common migraine with intractable migraine; Obesity in pregnancy; Supervision of other normal pregnancy, antepartum; Previous cesarean delivery for NRFHT in 2017; and Right hip pain on their problem list.  Patient reports occasional contractions and possible leaking of fluid since yesterday.  Contractions: Irregular. Vag. Bleeding: None.  Movement: Present.   The following portions of the patient's history were reviewed and updated as appropriate: allergies, current medications, past family history, past medical history, past social history, past surgical history and problem list. Problem list updated.  Objective:   Vitals:   10/21/17 1320  BP: 121/75  Pulse: 100  Weight: 263 lb (119.3 kg)    Fetal Status: Fetal Heart Rate (bpm): 141 Fundal Height: 33 cm Movement: Present     General:  Alert, oriented and cooperative. Patient is in no acute distress.  Skin: Skin is warm and dry. No rash noted.   Cardiovascular: Normal heart rate noted  Respiratory: Normal respiratory effort, no problems with respiration noted  Abdomen: Soft, gravid, appropriate for gestational age.  Pain/Pressure: Present     Pelvic: Cervical exam performed Dilation: Closed Effacement (%): Thick Station: Ballotable No pool/negative nitrazine.  Extremities: Normal range of motion.  Edema: None  Mental Status: Normal mood and affect. Normal behavior. Normal judgment and thought content.   Assessment and Plan:  Pregnancy: G2P1001 at 259w4d  1. Preterm uterine contractions in third trimester, antepartum No ROM seen, closed cervix. Patient reassured. Recommended continued hydration and Nifedipine as needed. PTL precautions advised.   2. Supervision of other normal pregnancy, antepartum Preterm labor  symptoms and general obstetric precautions including but not limited to vaginal bleeding, contractions, leaking of fluid and fetal movement were reviewed in detail with the patient. Please refer to After Visit Summary for other counseling recommendations.  Return for OB visits as scheduled.  Future Appointments  Date Time Provider Department Center  10/27/2017  3:15 PM Raziyah Vanvleck, Jethro BastosUgonna A, MD [facility]    Jaynie CollinsUgonna Arelys Glassco, MD

## 2017-10-27 ENCOUNTER — Ambulatory Visit (INDEPENDENT_AMBULATORY_CARE_PROVIDER_SITE_OTHER): Payer: 59 | Admitting: Obstetrics & Gynecology

## 2017-10-27 VITALS — BP 130/81 | HR 99 | Wt 266.0 lb

## 2017-10-27 DIAGNOSIS — Z3483 Encounter for supervision of other normal pregnancy, third trimester: Secondary | ICD-10-CM

## 2017-10-27 DIAGNOSIS — O34219 Maternal care for unspecified type scar from previous cesarean delivery: Secondary | ICD-10-CM

## 2017-10-27 DIAGNOSIS — Z3480 Encounter for supervision of other normal pregnancy, unspecified trimester: Secondary | ICD-10-CM

## 2017-10-27 NOTE — Patient Instructions (Signed)
Return to clinic for any scheduled appointments or obstetric concerns, or go to [facility] for evaluation  Vaginal Birth After Cesarean Delivery Vaginal birth after cesarean delivery (VBAC) is giving birth vaginally after previously delivering a baby by a cesarean. In the past, if a woman had a cesarean delivery, all births afterward would be done by cesarean delivery. This is no longer true. It can be safe for the mother to try a vaginal delivery after having a cesarean delivery. It is important to discuss VBAC with your health care provider early in the pregnancy so you can understand the risks, benefits, and options. It will give you time to decide what is best in your particular case. The final decision about whether to have a VBAC or repeat cesarean delivery should be between you and your health care provider. Any changes in your health or your baby's health during your pregnancy may make it necessary to change your initial decision about VBAC. Women who plan to have a VBAC should check with their health care provider to be sure that:  The previous cesarean delivery was done with a low transverse uterine cut (incision) (not a vertical classical incision).  The birth canal is big enough for the baby.  There were no other operations on the uterus.  An electronic fetal monitor (EFM) will be on at all times during labor.  An operating room will be available and ready in case an emergency cesarean delivery is needed.  A health care provider and surgical nursing staff will be available at all times during labor to be ready to do an emergency delivery cesarean if necessary.  An anesthesiologist will be present in case an emergency cesarean delivery is needed.  The nursery is prepared and has adequate personnel and necessary equipment available to care for the baby in case of an emergency cesarean delivery. Benefits of VBAC  Shorter stay in the hospital.  Avoidance of risks associated with cesarean  delivery, such as: ? Surgical complications, such as opening of the incision or hernia in the incision. ? Injury to other organs. ? Fever. This can occur if an infection develops after surgery. It can also occur as a reaction to the medicine given to make you numb during the surgery.  Less blood loss and need for blood transfusions.  Lower risk of blood clots and infection.  Shorter recovery.  Decreased risk for having to remove the uterus (hysterectomy).  Decreased risk for the placenta to completely or partially cover the opening of the uterus (placenta previa) with a future pregnancy.  Decrease risk in future labor and delivery. Risks of a VBAC  Tearing (rupture) of the uterus. This is occurs in less than 1% of VBACs. The risk of this happening is higher if: ? Steps are taken to begin the labor process (induce labor) or stimulate or strengthen contractions (augment labor). ? Medicine is used to soften (ripen) the cervix.  Having to remove the uterus (hysterectomy) if it ruptures. VBAC should not be done if:  The previous cesarean delivery was done with a vertical (classical) or T-shaped incision or you do not know what kind of incision was made.  You had a ruptured uterus.  You have had certain types of surgery on your uterus, such as removal of uterine fibroids. Ask your health care provider about other types of surgeries that prevent you from having a VBAC.  You have certain medical or childbirth (obstetrical) problems.  There are problems with the baby.  You have   had two previous cesarean deliveries and no vaginal deliveries. Other facts to know about VBAC:  It is safe to have an epidural anesthetic with VBAC.  It is safe to turn the baby from a breech position (attempt an external cephalic version).  It is safe to try a VBAC with twins.  VBAC may not be successful if your baby weights 8.8 lb (4 kg) or more. However, weight predictions are not always accurate and  should not be used alone to decide if VBAC is right for you.  There is an increased failure rate if the time between the cesarean delivery and VBAC is less than 19 months.  Your health care provider may advise against a VBAC if you have preeclampsia (high blood pressure, protein in the urine, and swelling of face and extremities).  VBAC is often successful if you previously gave birth vaginally.  VBAC is often successful when the labor starts spontaneously before the due date.  Delivering a baby through a VBAC is similar to having a normal spontaneous vaginal delivery. This information is not intended to replace advice given to you by your health care provider. Make sure you discuss any questions you have with your health care provider. Document Released: 09/29/2006 Document Revised: 09/14/2015 Document Reviewed: 11/05/2012 Elsevier Interactive Patient Education  2018 Elsevier Inc.  

## 2017-10-27 NOTE — Progress Notes (Signed)
   PRENATAL VISIT NOTE  Subjective:  Brenda Gutierrez is a 32 y.o. G2P1001 at 654w3d being seen today for ongoing prenatal care.  She is currently monitored for the following issues for this low-risk pregnancy and has Common migraine with intractable migraine; Obesity in pregnancy; Supervision of other normal pregnancy, antepartum; Previous cesarean delivery for NRFHT in 2017; and Right hip pain on their problem list.  Patient reports occasional contractions.  Contractions: Irregular. Vag. Bleeding: None.  Movement: Present. Denies leaking of fluid.   The following portions of the patient's history were reviewed and updated as appropriate: allergies, current medications, past family history, past medical history, past social history, past surgical history and problem list. Problem list updated.  Objective:   Vitals:   10/27/17 1520  BP: 130/81  Pulse: 99  Weight: 266 lb (120.7 kg)    Fetal Status: Fetal Heart Rate (bpm): 155   Movement: Present     General:  Alert, oriented and cooperative. Patient is in no acute distress.  Skin: Skin is warm and dry. No rash noted.   Cardiovascular: Normal heart rate noted  Respiratory: Normal respiratory effort, no problems with respiration noted  Abdomen: Soft, gravid, appropriate for gestational age.  Pain/Pressure: Present     Pelvic: Cervical exam deferred        Extremities: Normal range of motion.  Edema: Trace  Mental Status: Normal mood and affect. Normal behavior. Normal judgment and thought content.   Assessment and Plan:  Pregnancy: G2P1001 at 714w3d  1. Previous cesarean delivery for NRFHT in 2017 Counseled regarding TOLAC vs RCS; risks/benefits discussed in detail. All questions answered.  Patient elects for TOLAC, consent signed 10/27/2017.  2. Supervision of other normal pregnancy, antepartum Preterm labor symptoms and general obstetric precautions including but not limited to vaginal bleeding, contractions, leaking of fluid and  fetal movement were reviewed in detail with the patient. Please refer to After Visit Summary for other counseling recommendations.  Return in about 2 weeks (around 11/10/2017) for OB Visit, Pelvic cultures.  Future Appointments  Date Time Provider Department Center  11/10/2017  3:00 PM Levie HeritageStinson, Jacob J, DO [facility]    Jaynie CollinsUgonna Toney Lizaola, MD

## 2017-11-08 ENCOUNTER — Observation Stay
Admission: EM | Admit: 2017-11-08 | Discharge: 2017-11-08 | Disposition: A | Discharge status: Home / Self Care | Payer: 59 | Attending: Certified Nurse Midwife | Admitting: Certified Nurse Midwife

## 2017-11-08 DIAGNOSIS — O34219 Maternal care for unspecified type scar from previous cesarean delivery: Secondary | ICD-10-CM | POA: Insufficient documentation

## 2017-11-08 DIAGNOSIS — O4703 False labor before 37 completed weeks of gestation, third trimester: Secondary | ICD-10-CM | POA: Diagnosis not present

## 2017-11-08 DIAGNOSIS — Z880 Allergy status to penicillin: Secondary | ICD-10-CM | POA: Insufficient documentation

## 2017-11-08 DIAGNOSIS — O99213 Obesity complicating pregnancy, third trimester: Secondary | ICD-10-CM | POA: Insufficient documentation

## 2017-11-08 DIAGNOSIS — E669 Obesity, unspecified: Secondary | ICD-10-CM | POA: Diagnosis not present

## 2017-11-08 DIAGNOSIS — Z87891 Personal history of nicotine dependence: Secondary | ICD-10-CM | POA: Diagnosis not present

## 2017-11-08 DIAGNOSIS — Z3A36 36 weeks gestation of pregnancy: Secondary | ICD-10-CM | POA: Diagnosis not present

## 2017-11-08 DIAGNOSIS — Z91030 Bee allergy status: Secondary | ICD-10-CM | POA: Insufficient documentation

## 2017-11-08 DIAGNOSIS — Z91040 Latex allergy status: Secondary | ICD-10-CM | POA: Insufficient documentation

## 2017-11-08 DIAGNOSIS — Z888 Allergy status to other drugs, medicaments and biological substances status: Secondary | ICD-10-CM | POA: Insufficient documentation

## 2017-11-08 HISTORY — DX: Morbid (severe) obesity due to excess calories: E66.01

## 2017-11-08 NOTE — Discharge Instructions (Signed)
Return to Labor and delivery for contractions that are 3-5 minutes apart and feeling stronger than you are currently experiencing, or if your bag of water breaks, you have bleeding filling a pad, or if baby is not moving well. Also return for persistent lower abdominal pain between contractions Preeclampsia precautions: Please report headache unrelieved with Tylenol, spots in vision/double vision/ or loss of vision/ chest pain/ or upper abdominal pain

## 2017-11-08 NOTE — Final Progress Note (Signed)
Physician Final Progress Note  Patient ID: Brenda Gutierrez MRN: [id number] DOB/AGE: [birth year] 32 y.o.  Admit date: 11/08/2017 Admitting provider: Natale Milchhristanna R Schuman, MD Discharge date: 11/08/2017   Admission Diagnoses: IUP at 36weeks 1d with contractions Previous cesarean section  Discharge Diagnoses:  IUP at 36 weeks 1 d with false vs early labor  Consults: None  Significant Findings/ Diagnostic Studies:  32 y.o.WF  G2P1001 with EDC=12/05/2017 presents at 36 weeks1 day (by LMP and confirmed with first trimester ultrasound) presents with complaints of contractions every 5 minutes since 1030 this AM. Has had some leakage of fluid from the vagina PTA after taking a shower and her pad has felt wet. Baby active. Had some spotting 2 days ago, but no bleeding today. Has been seen for spotting and preterm contractions at 32 weeks and was taking procardia until 34 weeks. She also had preterm contractions last pregnancy and then was induced at 38 weeks for a Cat 2 tracing. She had a LTCS for fetal intolerance to labor 07/21/2015. She desires TOLAC. [facility]. Prenatal care also remarkable for obesity (current BMI 45.66 kg/m2), a total weight gain of 36# with the pregnancy,  common migraine, and pelvic pain (hip and back-has been using TENS unit, PT, Flexeril). ROS: see HPI, pertinent negatives: epigastric/ RUQ pain, visual changes, headaches, CP. Positive for nasal congestion Past Medical History:  Diagnosis Date  . Headache    migraines  . Morbid obesity (HCC)    Past Surgical History:  Procedure Laterality Date  . CESAREAN SECTION N/A 07/21/2015   Procedure: CESAREAN SECTION;  Surgeon: Hildred LaserAnika Cherry, MD;  Location: [facility] ORS;  Service: Obstetrics;  Laterality: N/A;  . OVARIAN CYST REMOVAL Left   . TONSILLECTOMY    . TUMOR REMOVAL  2003   throat-benign  . TUMOR REMOVAL  2005   right breast-benign   Family History  Problem Relation Age of Onset  .  Diabetes Mother   . Depression Mother   . Anxiety disorder Mother   . Depression Father   . Hypertension Father   . Diabetes Father   . Bipolar disorder Father   . Hyperlipidemia Father   . Hypertension Paternal Grandmother   . Hyperlipidemia Paternal Grandmother   . Hypertension Paternal Grandfather   . Hyperlipidemia Paternal Grandfather   . Breast cancer Paternal Aunt    Social History   Socioeconomic History  . Marital status: Married    Spouse name: Not on file  . Number of children: 1  . Years of education: Not on file  . Highest education level: Not on file  Occupational History  . Occupation: fitness Secondary school teacherinstructor    Comment: [employer]  Social Needs  . Financial resource strain: Not on file  . Food insecurity:    Worry: Not on file    Inability: Not on file  . Transportation needs:    Medical: Not on file    Non-medical: Not on file  Tobacco Use  . Smoking status: Former Smoker    Types: Cigarettes    Last attempt to quit: 07/26/2007    Years since quitting: 10.2  . Smokeless tobacco: Never Used  Substance and Sexual Activity  . Alcohol use: No    Alcohol/week: 0.0 oz    Comment: social  . Drug use: No  . Sexual activity: Yes    Partners: Male    Birth control/protection: None  Lifestyle  . Physical activity:    Days per week: Not on file  Minutes per session: Not on file  . Stress: Not on file  Relationships  . Social connections:    Talks on phone: Not on file    Gets together: Not on file    Attends religious service: Not on file    Active member of club or organization: Not on file    Attends meetings of clubs or organizations: Not on file    Relationship status: Not on file  . Intimate partner violence:    Fear of current or ex partner: Not on file    Emotionally abused: Not on file    Physically abused: Not on file    Forced sexual activity: Not on file  Other Topics Concern  . Not on file  Social History Narrative  . Not on file   Exam:  Vital signs:  Patient Vitals for the past 24 hrs:  BP Temp Temp src Pulse Height Weight  11/08/17 1442 130/73 - - -95 - -  11/08/17 1424 114/68 - - -99 - -  11/08/17 1350 126/68 - - - - -  11/08/17 1345 140/78 - - - - -  11/08/17 1248 (!) 142/78 97.6 F (36.4 C) Oral (!) 113 5\' 4"  (1.626 m) 120.7 kg (266 lb)   General: initially presented breathing through contractions Abdomen: Uterus tender with contractions, contractions palpate mild FHR: 140 baseline with accelerations to 160s, moderate variability, no declerations Toco: initially every 2-5 minutes apart, then spaced to every 6-7 minutes Cervix: closed/ 40%/ -2/ posterior/firm Nitrazine negative GBS done Ultrasound: cephalic/OP  A: IUP at 36 weeks 1 day: with preterm contractions (false vs prodromal labor) Previous Cesarean section-desires TOLAC FWB: Cat 1 tracing  P: DC home with labor and preeclampsia precautions [facility] up at [facility] in 2 days if NIL  Procedures: none  Discharge Condition: stable  Disposition: Discharge disposition: 01-Home or Self Care       Diet: Regular diet  Discharge Activity: Activity as tolerated  Discharge Instructions    Discharge patient   Complete by:  As directed    Discharge disposition:  01-Home or Self Care   Discharge patient date:  11/08/2017     Allergies as of 11/08/2017      Reactions   Bee Pollen Anaphylaxis   Cherry Flavor [flavoring Agent] Anaphylaxis   Insect Extract Allergy Skin Test Anaphylaxis   Penicillin G Anaphylaxis   Penicillins Anaphylaxis   Adhesive [tape]    Amoxicillin-pot Clavulanate Hives   Red Dye    Latex Rash      Medication List    STOP taking these medications   NIFEdipine 20 MG capsule Commonly known as:  PROCARDIA     TAKE these medications   acetaminophen 500 MG tablet Commonly known as:  TYLENOL Take 1,000 mg by mouth every 6 (six) hours as needed for mild pain.   FLEXERIL PO Take by mouth.   multivitamin-prenatal 27-0.8 MG Tabs  tablet Take 1 tablet by mouth daily at 12 noon.   TENS THERAPY PAIN RELIEF Devi 1 Units by Does not apply route 2 (two) times daily.        Total time spent taking care of this patient: 20 minutes  Signed: Farrel Conners 11/08/2017, 3:15 PM

## 2017-11-08 NOTE — OB Triage Note (Signed)
G2P1 7352w1d presents to BP d/t ctx Q5 minutes that began yesterday evening\, possible LOF noted, previous c/s, desires TOLAC.

## 2017-11-10 ENCOUNTER — Ambulatory Visit (INDEPENDENT_AMBULATORY_CARE_PROVIDER_SITE_OTHER): Payer: 59 | Admitting: Family Medicine

## 2017-11-10 VITALS — BP 124/82 | HR 72 | Wt 268.0 lb

## 2017-11-10 DIAGNOSIS — O99210 Obesity complicating pregnancy, unspecified trimester: Secondary | ICD-10-CM

## 2017-11-10 DIAGNOSIS — M25551 Pain in right hip: Secondary | ICD-10-CM

## 2017-11-10 DIAGNOSIS — Z3480 Encounter for supervision of other normal pregnancy, unspecified trimester: Secondary | ICD-10-CM

## 2017-11-10 DIAGNOSIS — O34219 Maternal care for unspecified type scar from previous cesarean delivery: Secondary | ICD-10-CM

## 2017-11-10 MED ORDER — PREDNISONE 20 MG PO TABS: 20.0000 mg | ORAL_TABLET | ORAL | 0 refills | Status: AC

## 2017-11-10 NOTE — Progress Notes (Signed)
   PRENATAL VISIT NOTE  Subjective:  Brenda Gutierrez is a 32 y.o. G2P1001 at 2430w3d being seen today for ongoing prenatal care.  She is currently monitored for the following issues for this low-risk pregnancy and has Common migraine with intractable migraine; Obesity in pregnancy; Supervision of other normal pregnancy, antepartum; Previous cesarean delivery for NRFHT in 2017; Right hip pain; and Indication for care in labor and delivery, antepartum on their problem list.  Patient reports right hip pain, radiating down leg with some mild numbness. Difficult to walk. Had 3 rounds of prednisone, which helped at the higher doses. Has used flexeril at night. .  Contractions: Irregular.  .  Movement: Present. Denies leaking of fluid.   The following portions of the patient's history were reviewed and updated as appropriate: allergies, current medications, past family history, past medical history, past social history, past surgical history and problem list. Problem list updated.  Objective:   Vitals:   11/10/17 1506  BP: 124/82  Pulse: 72  Weight: 268 lb (121.6 kg)    Fetal Status: Fetal Heart Rate (bpm): 145   Movement: Present     General:  Alert, oriented and cooperative. Patient is in no acute distress.  Skin: Skin is warm and dry. No rash noted.   Cardiovascular: Normal heart rate noted  Respiratory: Normal respiratory effort, no problems with respiration noted  Abdomen: Soft, gravid, appropriate for gestational age.  Pain/Pressure: Present     Pelvic: Cervical exam deferred        Extremities: Normal range of motion.  Edema: Trace  Mental Status: Normal mood and affect. Normal behavior. Normal judgment and thought content.   Assessment and Plan:  Pregnancy: G2P1001 at 2230w3d  1. Supervision of other normal pregnancy, antepartum FHT and FH normal  2. Right hip pain OMT attempted - too tight. Continue flexeril. Will give prednisone taper.  3. Obesity in pregnancy  4. Previous  cesarean delivery for NRFHT in 2017 Would like to TOLAC   Preterm labor symptoms and general obstetric precautions including but not limited to vaginal bleeding, contractions, leaking of fluid and fetal movement were reviewed in detail with the patient. Please refer to After Visit Summary for other counseling recommendations.  No follow-ups on file.  No future appointments.  Levie HeritageJacob J Hussein Macdougal, DO

## 2017-11-14 ENCOUNTER — Ambulatory Visit (INDEPENDENT_AMBULATORY_CARE_PROVIDER_SITE_OTHER): Payer: 59 | Admitting: Obstetrics & Gynecology

## 2017-11-14 ENCOUNTER — Encounter: Payer: Self-pay | Admitting: Obstetrics & Gynecology

## 2017-11-14 VITALS — BP 118/80 | HR 92 | Wt 275.2 lb

## 2017-11-14 DIAGNOSIS — O34219 Maternal care for unspecified type scar from previous cesarean delivery: Secondary | ICD-10-CM

## 2017-11-14 DIAGNOSIS — Z3480 Encounter for supervision of other normal pregnancy, unspecified trimester: Secondary | ICD-10-CM

## 2017-11-14 NOTE — Progress Notes (Signed)
   PRENATAL VISIT NOTE  Subjective:  Lavonna RuaBrandy B Aki is a 32 y.o. G2P1001 at 4232w0d being seen today for ongoing prenatal care.  She is currently monitored for the following issues for this low-risk pregnancy and has Common migraine with intractable migraine; Obesity in pregnancy; Supervision of other normal pregnancy, antepartum; Previous cesarean delivery for NRFHT in 2017; Right hip pain; and Indication for care in labor and delivery, antepartum on their problem list.  Patient reports no complaints.  Contractions: Irregular. Vag. Bleeding: None.  Movement: Present. Denies leaking of fluid.   The following portions of the patient's history were reviewed and updated as appropriate: allergies, current medications, past family history, past medical history, past social history, past surgical history and problem list. Problem list updated.  Objective:   Vitals:   11/14/17 1014  BP: 118/80  Pulse: 92  Weight: 275 lb 3.2 oz (124.8 kg)    Fetal Status:     Movement: Present     General:  Alert, oriented and cooperative. Patient is in no acute distress.  Skin: Skin is warm and dry. No rash noted.   Cardiovascular: Normal heart rate noted  Respiratory: Normal respiratory effort, no problems with respiration noted  Abdomen: Soft, gravid, appropriate for gestational age.  Pain/Pressure: Present     Pelvic: Cervical exam performed        Extremities: Normal range of motion.  Edema: Trace  Mental Status: Normal mood and affect. Normal behavior. Normal judgment and thought content.   Assessment and Plan:  Pregnancy: G2P1001 at 1732w0d  1. Supervision of other normal pregnancy, antepartum  - Culture, beta strep (group b only)  2. Previous cesarean delivery for NRFHT in 2017 - She is requesting an IOL asap for "I'm done with being pregnant". I have explained that with an unfavorable cervix and a history of a c/s, IOL for no medical reason is not going to happen today.  Preterm labor  symptoms and general obstetric precautions including but not limited to vaginal bleeding, contractions, leaking of fluid and fetal movement were reviewed in detail with the patient. Please refer to After Visit Summary for other counseling recommendations.  No follow-ups on file.  No future appointments.  Allie BossierMyra C Adelayde Minney, MD

## 2017-11-14 NOTE — Addendum Note (Signed)
Addended by: Hamilton CapriBURCH, Issaiah Seabrooks J on: 11/14/2017 10:54 AM   Modules accepted: Orders

## 2017-11-16 LAB — STREP GP B NAA: Strep Gp B NAA: NEGATIVE

## 2017-11-17 ENCOUNTER — Telehealth (HOSPITAL_COMMUNITY): Payer: Self-pay | Admitting: *Deleted

## 2017-11-17 ENCOUNTER — Telehealth: Payer: Self-pay

## 2017-11-17 NOTE — Telephone Encounter (Signed)
Called pt to inform her of her induction scheduled on 11/28/17 at 12am. Pt advised to arrive at 11:45pm. Understanding was voiced.

## 2017-11-17 NOTE — Telephone Encounter (Signed)
Preadmission screen  

## 2017-11-21 ENCOUNTER — Ambulatory Visit (INDEPENDENT_AMBULATORY_CARE_PROVIDER_SITE_OTHER): Payer: 59 | Admitting: Obstetrics and Gynecology

## 2017-11-21 ENCOUNTER — Other Ambulatory Visit: Payer: Self-pay | Admitting: Advanced Practice Midwife

## 2017-11-21 ENCOUNTER — Encounter: Payer: Self-pay | Admitting: Obstetrics and Gynecology

## 2017-11-21 VITALS — BP 117/78 | HR 96 | Wt 272.0 lb

## 2017-11-21 DIAGNOSIS — O0990 Supervision of high risk pregnancy, unspecified, unspecified trimester: Secondary | ICD-10-CM

## 2017-11-21 DIAGNOSIS — Z6841 Body Mass Index (BMI) 40.0 and over, adult: Secondary | ICD-10-CM | POA: Insufficient documentation

## 2017-11-21 DIAGNOSIS — O34219 Maternal care for unspecified type scar from previous cesarean delivery: Secondary | ICD-10-CM

## 2017-11-21 DIAGNOSIS — O99210 Obesity complicating pregnancy, unspecified trimester: Secondary | ICD-10-CM

## 2017-11-24 ENCOUNTER — Encounter: Payer: Self-pay | Admitting: Obstetrics and Gynecology

## 2017-11-24 NOTE — Progress Notes (Signed)
Prenatal Visit Note Date: 11/21/2017 Clinic: [facility]  Subjective:  Brenda Gutierrez is a 32 y.o. G2P1001 at 357w0d being seen today for ongoing prenatal care.  She is currently monitored for the following issues for this high-risk pregnancy and has Common migraine with intractable migraine; Obesity in pregnancy; Supervision of high risk pregnancy, antepartum; Previous cesarean delivery for NRFHT in 2017; Right hip pain; and BMI 45.0-49.9, adult (HCC) on their problem list.  Patient reports no complaints.   Contractions: Irregular. Vag. Bleeding: None.  Movement: Present. Denies leaking of fluid.   The following portions of the patient's history were reviewed and updated as appropriate: allergies, current medications, past family history, past medical history, past social history, past surgical history and problem list. Problem list updated.  Objective:   Vitals:   11/21/17 1144  BP: 117/78  Pulse: 96  Weight: 272 lb (123.4 kg)    Fetal Status: Fetal Heart Rate (bpm): 142 Fundal Height: 39 cm Movement: Present  Presentation: Vertex  General:  Alert, oriented and cooperative. Patient is in no acute distress.  Skin: Skin is warm and dry. No rash noted.   Cardiovascular: Normal heart rate noted  Respiratory: Normal respiratory effort, no problems with respiration noted  Abdomen: Soft, gravid, appropriate for gestational age. Pain/Pressure: Present     Pelvic:  Cervical exam deferred        Extremities: Normal range of motion.  Edema: Mild pitting, slight indentation  Mental Status: Normal mood and affect. Normal behavior. Normal judgment and thought content.   Urinalysis:      Assessment and Plan:  Pregnancy: G2P1001 at 5555w3d  1. Supervision of high risk pregnancy, antepartum Routine care. GBS neg  2. Previous cesarean delivery for NRFHT in 2017 Scheduled for 39/0 IOL. tolac consent already signed  3. BMI 45.0-49.9, adult (HCC)  4. Obesity in  pregnancy  Term labor symptoms and general obstetric precautions including but not limited to vaginal bleeding, contractions, leaking of fluid and fetal movement were reviewed in detail with the patient. Please refer to After Visit Summary for other counseling recommendations.  Return in about 5 weeks (around 12/26/2017) for pp visit.   Shawano BingPickens, Sela Falk, MD

## 2017-11-27 ENCOUNTER — Encounter (HOSPITAL_COMMUNITY): Payer: Self-pay

## 2017-11-27 ENCOUNTER — Inpatient Hospital Stay (EMERGENCY_DEPARTMENT_HOSPITAL)
Admission: AD | Admit: 2017-11-27 | Discharge: 2017-11-28 | Disposition: A | Discharge status: Home / Self Care | Payer: 59 | Source: Ambulatory Visit | Attending: Obstetrics and Gynecology | Admitting: Obstetrics and Gynecology

## 2017-11-27 DIAGNOSIS — O26893 Other specified pregnancy related conditions, third trimester: Principal | ICD-10-CM

## 2017-11-27 DIAGNOSIS — N898 Other specified noninflammatory disorders of vagina: Secondary | ICD-10-CM

## 2017-11-27 NOTE — MAU Note (Signed)
Pt states she thinks water broke this afternoon around 2:30- clear. States she has soaked through 2 "heavy pads". Pt reports contractions every 12-15 mins. Pt reports good fetal movement. States she is the midnight induction. Cervix has not been examined.

## 2017-11-27 NOTE — MAU Provider Note (Signed)
Chief Complaint:  Rupture of Membranes   First Provider Initiated Contact with Patient 11/27/17 2331     HPI  HPI: Brenda Gutierrez is a 32 y.o. G2P1001 at 2938w6dwho presents to maternity admissions reporting leaking of fluid since 1430hrs today.  Scheduled for IOL at [facility] but put on hold due to bed availability.. She reports good fetal movement, denies vaginal bleeding, vaginal itching/burning, urinary symptoms, h/a, dizziness, n/v, diarrhea, constipation or fever/chills.  .  RN Note: Pt states she thinks water broke this afternoon around 2:30- clear. States she has soaked through 2 "heavy pads". Pt reports contractions every 12-15 mins. Pt reports good fetal movement. States she is the midnight induction. Cervix has not been examined  Past Medical History: Past Medical History:  Diagnosis Date  . Headache    migraines  . Morbid obesity (HCC)     Past obstetric history: OB History  Gravida Para Term Preterm AB Living  2 1 1  0 0 1  SAB TAB Ectopic Multiple Live Births  0 0 0 0 1    # Outcome Date GA Lbr Len/2nd Weight Sex Delivery Anes PTL Lv  2 Current           1 Term 07/21/15 7344w3d  3310 g F CS-LVertical Spinal  LIV     Birth Comments: none observed     Complications: Fetal Intolerance    Obstetric Comments  Cesarean section was a low transverse incision, not a low vertical (this is an error)    Past Surgical History: Past Surgical History:  Procedure Laterality Date  . CESAREAN SECTION N/A 07/21/2015   Procedure: CESAREAN SECTION;  Surgeon: Hildred LaserAnika Cherry, MD;  Location: [facility] ORS;  Service: Obstetrics;  Laterality: N/A;  . OVARIAN CYST REMOVAL Left   . TONSILLECTOMY    . TUMOR REMOVAL  2003   throat-benign  . TUMOR REMOVAL  2005   right breast-benign    Family History: Family History  Problem Relation Age of Onset  . Diabetes Mother   . Depression Mother   . Anxiety disorder Mother   . Depression Father   . Hypertension Father   . Diabetes Father   . Bipolar  disorder Father   . Hyperlipidemia Father   . Hypertension Paternal Grandmother   . Hyperlipidemia Paternal Grandmother   . Hypertension Paternal Grandfather   . Hyperlipidemia Paternal Grandfather   . Breast cancer Paternal Aunt     Social History: Social History   Tobacco Use  . Smoking status: Former Smoker    Types: Cigarettes    Last attempt to quit: 07/26/2007    Years since quitting: 10.3  . Smokeless tobacco: Never Used  Substance Use Topics  . Alcohol use: No    Alcohol/week: 0.0 standard drinks    Comment: social  . Drug use: No    Allergies:  Allergies  Allergen Reactions  . Bee Pollen Anaphylaxis  . Cherry Flavor [Flavoring Agent] Anaphylaxis  . Insect Extract Allergy Skin Test Anaphylaxis  . Penicillin G Anaphylaxis  . Penicillins Anaphylaxis  . Adhesive [Tape]   . Amoxicillin-Pot Clavulanate Hives  . Red Dye   . Latex Rash    Meds:  Medications Prior to Admission  Medication Sig Dispense Refill Last Dose  . acetaminophen (TYLENOL) 500 MG tablet Take 1,000 mg by mouth every 6 (six) hours as needed for mild pain.   Taking  . Cyclobenzaprine HCl (FLEXERIL PO) Take by mouth.   Taking  . Nerve Stimulator (TENS THERAPY PAIN RELIEF)  DEVI 1 Units by Does not apply route 2 (two) times daily. 1 Device 0 Taking  . Prenatal Vit-Fe Fumarate-FA (MULTIVITAMIN-PRENATAL) 27-0.8 MG TABS tablet Take 1 tablet by mouth daily at 12 noon.   Taking  . ranitidine (ZANTAC) 150 MG tablet Take 150 mg by mouth as needed for heartburn.   Taking    I have reviewed patient's Past Medical Hx, Surgical Hx, Family Hx, Social Hx, medications and allergies.   ROS:  Review of Systems  Respiratory: Negative for shortness of breath.   Cardiovascular: Negative for leg swelling.  Gastrointestinal: Negative for constipation, diarrhea, nausea and vomiting.  Genitourinary: Positive for vaginal discharge. Negative for pelvic pain and vaginal bleeding.  Neurological: Negative for dizziness.    Other systems negative  Physical Exam   Patient Vitals for the past 24 hrs:  BP Temp Temp src Pulse Resp SpO2 Height Weight  11/27/17 2207 137/81 97.6 F (36.4 C) Oral (!) 112 18 99 % 5\' 4"  (1.626 m) 124.7 kg   Constitutional: Well-developed, well-nourished female in no acute distress.  Cardiovascular: normal rate and rhythm Respiratory: normal effort, clear to auscultation bilaterally GI: Abd soft, non-tender, gravid appropriate for gestational age.   No rebound or guarding. MS: Extremities nontender, no edema, normal ROM Neurologic: Alert and oriented x 4.  GU: Neg CVAT.  PELVIC EXAM: Cervix pink, visually closed, without lesion, scant white creamy discharge, vaginal walls and external genitalia normal   NO POOLING< NO FERNING  Dilation: Closed Effacement (%): Thick Station: Ballotable Exam by:: Cheveyo Virginia, CNM   FHT:  Baseline 140 , moderate variability, accelerations present, no decelerations Contractions:  Irregular     Labs: No results found for this or any previous visit (from the past 24 hour(s)).  A/Positive/-- (01/11 1540)  Imaging:  No results found.  MAU Course/MDM: NST reviewed, reactive.  Treatments in MAU included EFM.    Assessment: Intrauterine pregnancy at [redacted]w[redacted]d Vaginal discharge, no evidence of ruptured membranes  Plan: Discharge home Labor precautions and fetal kick counts Follow up for IOL when called Encouraged to return here or to other Urgent Care/ED if she develops worsening of symptoms, increase in pain, fever, or other concerning symptoms.  Pt stable at time of discharge.  Wynelle Bourgeois CNM, MSN Certified Nurse-Midwife 11/27/2017 11:32 PM

## 2017-11-27 NOTE — Discharge Instructions (Signed)
Early Elective Birth °Introduction °Early elective birth refers to making a choice to have a baby before the time the baby is due. The length of a pregnancy is 9 months, or 40 weeks, starting from the beginning of a woman's last menstrual period. Most women naturally go into labor around 40 weeks of gestation. A full-term pregnancy is considered between 37 weeks and 42 weeks of gestation. Currently, early elective births can take place sometime after 39 weeks of gestation. Most health care providers practice within the guidelines of delivering a baby no later than 42 weeks of gestation and no earlier than 39 weeks of gestation. There are exceptions to this time interval, and the risks involved to the mother and baby need to be considered in those cases. °Induction of labor refers to the use of medicines to bring about contractions. Labor is when the cervix starts to widen (dilate). Active labor is when there are contractions and the cervix has dilated to at least 4 cm. Oftentimes, the earlier a mother is in her pregnancy, the longer it takes to get induced. When the cervix is ready (dilated and soft), an induction may take less than a day. However, when a cervix is far away from being ready (long, closed, and firm), it may take days in a hospital for labor to start. °Currently, 39 weeks of gestation is considered the earliest a health care provider should start the induction process. This is because the longer the baby stays inside the uterus, the lower the risks are to both the baby and mother. However, sometimes there are very good reasons for a pregnancy to be induced before 39 weeks of gestation. These exceptions are specific to each individual pregnancy and need to be considered on a case-by-case basis. A good reason to induce one pregnancy may not be a good reason to induce another another pregnancy. °REASONS FOR ELECTIVE BIRTH °It may be safer to induce labor before 39 weeks of gestation if: °· A woman is  carrying more than 1 baby. Current standards are to deliver twin pregnancies at 38 weeks of gestation. °· A woman is having complications, such as: °? High blood pressure caused by pregnancy (preeclampsia). °? Bleeding. °? Infection. °· There are conditions affecting the baby's health, such as: °? Intrauterine growth restriction (IUGR), where the baby is not growing well. °? Having abnormal fetal heart rate patterns on the monitor (nonreassuring tracing). °? Having a lack of fluid that surrounds the baby (oligohydramnios). °? Having issues with the placenta. °· Fluid that surrounds the baby (amniotic fluid) is leaking. ° °There are many other safety reasons that a pregnancy may need to be induced early. °REASONS AGAINST ELECTIVE BIRTH °Sometimes early elective birth is not the best choice. It may not be a good idea if: °· An early birth is chosen just for convenience. °· You want the baby to be born on a certain date, like a holiday. °· You are more likely to need a cesarean delivery before 39 weeks of gestation. ° °A cesarean delivery can lead to other problems. Problems include infection, bleeding, and not having enough iron in your blood (anemia), which can cause weakness. °Babies born early (34-37 weeks of gestation): °· May need special care at the hospital or in a special care nursery. °· Are at a greater risk for: °? Infection. °? Brain damage or bleeding inside the brain. °? Dying during their first year of life. °? Feeding problems. °? Breathing problems. °? Slow physical and mental development. °· May   need special care in a neonatal intensive care unit (NICU), but this is rare. The length of the baby's stay in the hospital will depend on how quickly he or she progresses to a safe level of care. ° °REDUCING EARLY ELECTIVE BIRTHS °Carrying a baby longer than 42 weeks of gestation is not good for the baby or the mother. A full-term pregnancy is best for baby and mother. Anything earlier can be risky for you  and your baby. Remember: °· An early elective birth may lead to a cesarean delivery. This can lead to other problems for the mother and baby. °· An early elective birth can result in developmental problems for your child. °· A baby's brain continues to develop while in the uterus. °· A baby's body continues to develop. The baby will be better able to breathe and eat when he or she is born near the due date. °· A baby who stays in the uterus longer responds better. The baby will also bond better with you. ° °This information is not intended to replace advice given to you by your health care provider. Make sure you discuss any questions you have with your health care provider. °Document Released: 12/19/2010 Document Revised: 09/14/2015 Document Reviewed: 11/05/2012 °Elsevier Interactive Patient Education © 2017 Elsevier Inc. ° °

## 2017-11-28 ENCOUNTER — Inpatient Hospital Stay (HOSPITAL_COMMUNITY)
Admission: RE | Admit: 2017-11-28 | Discharge: 2017-12-01 | DRG: 785 | Disposition: A | Discharge status: Home / Self Care | Payer: 59 | Attending: Obstetrics and Gynecology | Admitting: Obstetrics and Gynecology

## 2017-11-28 ENCOUNTER — Encounter (HOSPITAL_COMMUNITY): Payer: Self-pay

## 2017-11-28 ENCOUNTER — Other Ambulatory Visit: Payer: Self-pay

## 2017-11-28 VITALS — BP 131/67 | HR 81 | Temp 97.8°F | Resp 18 | Ht 64.0 in | Wt 273.5 lb

## 2017-11-28 DIAGNOSIS — Z87891 Personal history of nicotine dependence: Secondary | ICD-10-CM

## 2017-11-28 DIAGNOSIS — Z3490 Encounter for supervision of normal pregnancy, unspecified, unspecified trimester: Secondary | ICD-10-CM | POA: Diagnosis present

## 2017-11-28 DIAGNOSIS — Z3A39 39 weeks gestation of pregnancy: Secondary | ICD-10-CM | POA: Diagnosis not present

## 2017-11-28 DIAGNOSIS — O34211 Maternal care for low transverse scar from previous cesarean delivery: Secondary | ICD-10-CM | POA: Diagnosis present

## 2017-11-28 DIAGNOSIS — O99214 Obesity complicating childbirth: Secondary | ICD-10-CM | POA: Diagnosis present

## 2017-11-28 DIAGNOSIS — Z880 Allergy status to penicillin: Secondary | ICD-10-CM | POA: Diagnosis not present

## 2017-11-28 DIAGNOSIS — O26893 Other specified pregnancy related conditions, third trimester: Secondary | ICD-10-CM

## 2017-11-28 DIAGNOSIS — Z91040 Latex allergy status: Secondary | ICD-10-CM | POA: Diagnosis not present

## 2017-11-28 DIAGNOSIS — N898 Other specified noninflammatory disorders of vagina: Secondary | ICD-10-CM

## 2017-11-28 DIAGNOSIS — O0990 Supervision of high risk pregnancy, unspecified, unspecified trimester: Secondary | ICD-10-CM

## 2017-11-28 DIAGNOSIS — O9962 Diseases of the digestive system complicating childbirth: Secondary | ICD-10-CM | POA: Diagnosis present

## 2017-11-28 DIAGNOSIS — Z302 Encounter for sterilization: Secondary | ICD-10-CM

## 2017-11-28 DIAGNOSIS — Z98891 History of uterine scar from previous surgery: Secondary | ICD-10-CM

## 2017-11-28 DIAGNOSIS — K219 Gastro-esophageal reflux disease without esophagitis: Secondary | ICD-10-CM | POA: Diagnosis present

## 2017-11-28 LAB — CBC
HCT: 38.4 % (ref 36.0–46.0)
Hemoglobin: 12.6 g/dL (ref 12.0–15.0)
MCH: 27.6 pg (ref 26.0–34.0)
MCHC: 32.8 g/dL (ref 30.0–36.0)
MCV: 84 fL (ref 78.0–100.0)
PLATELETS: 268 10*3/uL (ref 150–400)
RBC: 4.57 MIL/uL (ref 3.87–5.11)
RDW: 15.1 % (ref 11.5–15.5)
WBC: 14.7 10*3/uL — AB (ref 4.0–10.5)

## 2017-11-28 LAB — TYPE AND SCREEN
ABO/RH(D): A POS
ANTIBODY SCREEN: NEGATIVE

## 2017-11-28 LAB — ABO/RH: ABO/RH(D): A POS

## 2017-11-28 LAB — RPR: RPR Ser Ql: NONREACTIVE

## 2017-11-28 MED ORDER — TERBUTALINE SULFATE 1 MG/ML IJ SOLN: 0.2500 mg | Freq: Once | INTRAMUSCULAR | Status: DC | PRN

## 2017-11-28 MED ORDER — LACTATED RINGERS IV SOLN
500.0000 mL | INTRAVENOUS | Status: DC | PRN
  Administered 2017-11-28 – 2017-11-29 (×3): 500 mL via INTRAVENOUS

## 2017-11-28 MED ORDER — ONDANSETRON HCL 4 MG/2ML IJ SOLN: 4.0000 mg | Freq: Four times a day (QID) | INTRAMUSCULAR | Status: DC | PRN

## 2017-11-28 MED ORDER — OXYTOCIN 40 UNITS IN LACTATED RINGERS INFUSION - SIMPLE MED
1.0000 m[IU]/min | INTRAVENOUS | Status: DC
  Administered 2017-11-28 (×3): 2 m[IU]/min via INTRAVENOUS
  Administered 2017-11-29: 4 m[IU]/min via INTRAVENOUS

## 2017-11-28 MED ORDER — SOD CITRATE-CITRIC ACID 500-334 MG/5ML PO SOLN
30.0000 mL | ORAL | Status: DC | PRN
  Administered 2017-11-29: 30 mL via ORAL
  Filled 2017-11-28: qty 15

## 2017-11-28 MED ORDER — OXYTOCIN BOLUS FROM INFUSION: 500.0000 mL | Freq: Once | INTRAVENOUS | Status: DC

## 2017-11-28 MED ORDER — ACETAMINOPHEN 325 MG PO TABS: 650.0000 mg | ORAL_TABLET | ORAL | Status: DC | PRN

## 2017-11-28 MED ORDER — OXYCODONE-ACETAMINOPHEN 5-325 MG PO TABS: 2.0000 | ORAL_TABLET | ORAL | Status: DC | PRN

## 2017-11-28 MED ORDER — LACTATED RINGERS IV SOLN
INTRAVENOUS | Status: DC
  Administered 2017-11-28 – 2017-11-29 (×3): via INTRAVENOUS

## 2017-11-28 MED ORDER — OXYTOCIN 40 UNITS IN LACTATED RINGERS INFUSION - SIMPLE MED
2.5000 [IU]/h | INTRAVENOUS | Status: DC
  Filled 2017-11-28: qty 1000

## 2017-11-28 MED ORDER — OXYCODONE-ACETAMINOPHEN 5-325 MG PO TABS: 1.0000 | ORAL_TABLET | ORAL | Status: DC | PRN

## 2017-11-28 MED ORDER — FENTANYL CITRATE (PF) 100 MCG/2ML IJ SOLN
100.0000 ug | INTRAMUSCULAR | Status: DC | PRN
  Administered 2017-11-29 (×2): 100 ug via INTRAVENOUS
  Filled 2017-11-28 (×2): qty 2

## 2017-11-28 MED ORDER — LIDOCAINE HCL (PF) 1 % IJ SOLN: 30.0000 mL | INTRAMUSCULAR | Status: DC | PRN

## 2017-11-28 NOTE — Progress Notes (Signed)
Patient ID: Lavonna RuaBrandy B Martos, female   DOB: [birth year], 32 y.o.   MRN: [id number]  Thomes Lollingit was off a little earlier due to variables; pt had a chance to shower; Pit recently restarted; she is having her same back pain; unable to place foley earlier (see note)  BP 146/76, other VSS FHR 120s, +accels, no decels, +LTV, Cat 1 Ctx irreg, mild 2-5 mins with Pit @ 554mu/min Cx- checked earlier, still closed  IUP@term  Unfavorable cx TOLAC  Will continue increasing Pit as FHR tolerates Dr Emelda FearFerguson rev'd plan of care with pt: will try cervical foley placement again at 0600  SHAW, [facility]IM[facility] CNM 11/28/2017 10:52 PM

## 2017-11-28 NOTE — Anesthesia Pain Management Evaluation Note (Signed)
  CRNA Pain Management Visit Note  Patient: Brenda RuaBrandy B Waring, 32 y.o., female  "Hello I am a [facility] of the anesthesia [facility]. We have an anesthesia team available at all times to provide care throughout the hospital, including epidural management and anesthesia for C-section. I don't know your plan for the delivery whether it a natural birth, water birth, IV sedation, nitrous supplementation, doula or epidural, but we want to meet your pain goals."   1.Was your pain managed to your expectations on prior hospitalizations?   No prior hospitalizations  2.What is your expectation for pain management during this hospitalization?     Epidural  3.How can we help you reach that goal? Epidural when ready  Record the patient's initial score and the patient's pain goal.   Pain: 2  Pain Goal: 9 [facility] wants you to be able to say your pain was always managed very well.  Edison PaceWILKERSON,Kaleya Douse 11/28/2017

## 2017-11-28 NOTE — Progress Notes (Signed)
Attempted to place a FB with pederson speculum. Incomplete visualization of cervix due to posterior location. Attempted to guide foley catheter into the cervical os with assistance of ring forceps. Was unable to proceed due to pain. Dr. Emelda FearFerguson attempted to place digitally but unable to due to lack of cervical dilation. Will attempt to re-place later.   Marcy Sirenatherine Wallace, D.O. OB Fellow  11/28/2017, 6:11 PM

## 2017-11-28 NOTE — Progress Notes (Signed)
Brenda Gutierrez is 32 y.o. G2P1001 patient at 7919w0d here for elective IOL. Called to room for repetitive late decels to 120 bpm lasting 1-2 minutes. Patient had already been given 500 cc IVF bolus and had been placed in semi-fowlers. Patient was left lateral recumbent upon entering room. Discontinued Pitocin. Transitioned to right lateral position with normalization of FHT. Will continue to monitor, holding Pit for now. Will re-check cervix soon and likely attempt to place FB.   Marcy Sirenatherine Brennen Camper, D.O. OB Fellow  11/28/2017, 2:15 PM

## 2017-11-28 NOTE — H&P (Signed)
Brenda Gutierrez is a 32 y.o. female G2P1001 with IUP at [redacted]w[redacted]d presenting for elective IOL. [facility]  Prenatal History/Complications:  CS for NRFHT--cx was closed   Past Medical History: Past Medical History:  Diagnosis Date  . Headache    migraines  . Morbid obesity (HCC)     Past Surgical History: Past Surgical History:  Procedure Laterality Date  . CESAREAN SECTION N/A 07/21/2015   Procedure: CESAREAN SECTION;  Surgeon: Hildred Laser, MD;  Location: [facility] ORS;  Service: Obstetrics;  Laterality: N/A;  . OVARIAN CYST REMOVAL Left   . TONSILLECTOMY    . TUMOR REMOVAL  2003   throat-benign  . TUMOR REMOVAL  2005   right breast-benign    Obstetrical History: OB History    Gravida  2   Para  1   Term  1   Preterm  0   AB  0   Living  1     SAB  0   TAB  0   Ectopic  0   Multiple  0   Live Births  1        Obstetric Comments  Cesarean section was a low transverse incision, not a low vertical (this is an error)        Social History: Social History   Socioeconomic History  . Marital status: Married    Spouse name: Chrissie Noa  . Number of children: 1  . Years of education: 80  . Highest education level: Bachelor's degree (e.g., BA, AB, BS)  Occupational History  . Occupation: Stay at home mother  Social Needs  . Financial resource strain: Not hard at all  . Food insecurity:    Worry: Never true    Inability: Never true  . Transportation needs:    Medical: No    Non-medical: No  Tobacco Use  . Smoking status: Former Smoker    Types: Cigarettes    Last attempt to quit: 07/26/2007    Years since quitting: 10.3  . Smokeless tobacco: Never Used  Substance and Sexual Activity  . Alcohol use: No    Alcohol/week: 0.0 standard drinks    Comment: social  . Drug use: No  . Sexual activity: Not Currently    Partners: Male    Birth control/protection: None  Lifestyle  . Physical activity:    Days per week: 5 days    Minutes per  session: 50 min  . Stress: Not at all  Relationships  . Social connections:    Talks on phone: More than three times a week    Gets together: Once a week    Attends religious service: Never    Active member of club or organization: Yes    Attends meetings of clubs or organizations: More than 4 times per year    Relationship status: Married  Other Topics Concern  . Not on file  Social History Narrative  . Not on file    Family History: Family History  Problem Relation Age of Onset  . Diabetes Mother   . Depression Mother   . Anxiety disorder Mother   . Depression Father   . Hypertension Father   . Diabetes Father   . Bipolar disorder Father   . Hyperlipidemia Father   . Hypertension Paternal Grandmother   . Hyperlipidemia Paternal Grandmother   . Hypertension Paternal Grandfather   . Hyperlipidemia Paternal Grandfather   . Breast cancer Paternal Aunt     Allergies: Allergies  Allergen Reactions  .  Bee Pollen Anaphylaxis  . Cherry Flavor [Flavoring Agent] Anaphylaxis  . Insect Extract Allergy Skin Test Anaphylaxis  . Penicillin G Anaphylaxis  . Penicillins Anaphylaxis  . Adhesive [Tape]   . Amoxicillin-Pot Clavulanate Hives  . Red Dye   . Latex Rash    Medications Prior to Admission  Medication Sig Dispense Refill Last Dose  . acetaminophen (TYLENOL) 500 MG tablet Take 1,000 mg by mouth every 6 (six) hours as needed for mild pain.   Taking  . Cyclobenzaprine HCl (FLEXERIL PO) Take by mouth.   Taking  . Nerve Stimulator (TENS THERAPY PAIN RELIEF) DEVI 1 Units by Does not apply route 2 (two) times daily. 1 Device 0 Taking  . Prenatal Vit-Fe Fumarate-FA (MULTIVITAMIN-PRENATAL) 27-0.8 MG TABS tablet Take 1 tablet by mouth daily at 12 noon.   Taking  . ranitidine (ZANTAC) 150 MG tablet Take 150 mg by mouth as needed for heartburn.   Taking        Review of Systems   Constitutional: Negative for fever and chills Eyes: Negative for visual  disturbances Respiratory: Negative for shortness of breath, dyspnea Cardiovascular: Negative for chest pain or palpitations  Gastrointestinal: Negative for abdominal pain, vomiting, diarrhea and constipation.   Genitourinary: Negative for dysuria and urgency Musculoskeletal: Negative for back pain, joint pain, myalgias  Neurological: Negative for dizziness and headaches      Blood pressure 135/83, pulse 90, temperature 97.9 F (36.6 C), temperature source Oral, resp. rate 18, height 5\' 4"  (1.626 m), weight 124.1 kg, last menstrual period 02/28/2017, currently breastfeeding. General appearance: alert, cooperative and no distress Lungs: clear to auscultation bilaterally Heart: regular rate and rhythm Abdomen: soft, non-tender; bowel sounds normal Extremities: Homans sign is negative, no sign of DVT DTR's 2+ Presentation: cephalic Fetal monitoring  Baseline: 140 bpm, Variability: Good {> 6 bpm), Accelerations: Reactive and Decelerations: Absent Uterine activity  irregular      Prenatal labs: ABO, Rh: A/Positive/-- (01/11 1540) Antibody: Negative (01/22 1009) Rubella: immune RPR: Non Reactive (05/24 0842)  HBsAg: Negative (01/11 1540)  HIV: Non Reactive (05/24 0842)  GBS: Negative (07/26 1100)    Prenatal Transfer Tool  Maternal Diabetes: No Genetic Screening: Normal Maternal Ultrasounds/Referrals: Normal Fetal Ultrasounds or other Referrals:  None Maternal Substance Abuse:  No Significant Maternal Medications:  None Significant Maternal Lab Results: None    Results for orders placed or performed during the hospital encounter of 11/28/17 (from the past 24 hour(s))  CBC   Collection Time: 11/28/17  7:35 AM  Result Value Ref Range   WBC 14.7 (H) 4.0 - 10.5 K/uL   RBC 4.57 3.87 - 5.11 MIL/uL   Hemoglobin 12.6 12.0 - 15.0 g/dL   HCT 16.1 09.6 - 04.5 %   MCV 84.0 78.0 - 100.0 fL   MCH 27.6 26.0 - 34.0 pg   MCHC 32.8 30.0 - 36.0 g/dL   RDW 40.9 81.1 - 91.4 %    Platelets 268 150 - 400 K/uL    Clinic [facility] Prenatal Labs  Dating LMP c/w 10 week scan Blood type: A/Positive/-- (01/11 1540)   Genetic Screen NIPS: Low risk female Antibody: Negative  Anatomic [place] 07/25/17 - needs f/u around 08/24/17-WNL but spine is suboptimally viewed-09/22/17 Everything normal Rubella: 1.42 (01/11 1540)  GTT Early: HgA1C 5.3    Third trimester: neg RPR: Non Reactive (01/11 1540)   Flu vaccine Declined HBsAg: Negative (01/11 1540)   TDaP vaccine 09/12/2017  HIV: Non Reactive (01/11 1540)  Baby Food Breast GBS:  negative  Contraception BTS Pap: 01/20/2015 Negative  Circumcision No   Pediatrician [facility] = Dr Page   Support Person Bill   Prenatal Classes Done     Assessment: Brenda Gutierrez is a 32 y.o. G2P1001 with an IUP at 385w0d presenting for elective IOL , TOLAC  Plan: #Labor: Cytotec->Foley->pitocin #Pain:  Per request #FWB Cat 1    Brenda Gutierrez 11/28/2017, 7:54 AM

## 2017-11-29 ENCOUNTER — Encounter (HOSPITAL_COMMUNITY): Payer: Self-pay

## 2017-11-29 ENCOUNTER — Inpatient Hospital Stay (HOSPITAL_COMMUNITY): Payer: 59 | Admitting: Anesthesiology

## 2017-11-29 ENCOUNTER — Encounter (HOSPITAL_COMMUNITY)
Admission: RE | Disposition: A | Discharge status: Home / Self Care | Payer: Self-pay | Source: Home / Self Care | Attending: Obstetrics and Gynecology

## 2017-11-29 DIAGNOSIS — Z302 Encounter for sterilization: Secondary | ICD-10-CM

## 2017-11-29 DIAGNOSIS — Z3A39 39 weeks gestation of pregnancy: Secondary | ICD-10-CM

## 2017-11-29 HISTORY — PX: TUBAL LIGATION: SHX77

## 2017-11-29 SURGERY — Surgical Case
Anesthesia: Spinal | Site: Abdomen | Wound class: Clean Contaminated

## 2017-11-29 MED ORDER — KETOROLAC TROMETHAMINE 30 MG/ML IJ SOLN: 30.0000 mg | Freq: Four times a day (QID) | INTRAMUSCULAR | Status: AC | PRN

## 2017-11-29 MED ORDER — OXYTOCIN 40 UNITS IN LACTATED RINGERS INFUSION - SIMPLE MED: 2.5000 [IU]/h | INTRAVENOUS | Status: AC

## 2017-11-29 MED ORDER — FENTANYL CITRATE (PF) 100 MCG/2ML IJ SOLN
INTRAMUSCULAR | Status: AC
  Filled 2017-11-29: qty 2

## 2017-11-29 MED ORDER — LACTATED RINGERS IV SOLN
INTRAVENOUS | Status: DC
  Administered 2017-11-29: 08:00:00 via INTRAVENOUS

## 2017-11-29 MED ORDER — IBUPROFEN 600 MG PO TABS
600.0000 mg | ORAL_TABLET | Freq: Four times a day (QID) | ORAL | Status: DC
  Administered 2017-11-29 – 2017-12-01 (×10): 600 mg via ORAL
  Filled 2017-11-29 (×10): qty 1

## 2017-11-29 MED ORDER — SIMETHICONE 80 MG PO CHEW
80.0000 mg | CHEWABLE_TABLET | ORAL | Status: DC | PRN
  Filled 2017-11-29: qty 1

## 2017-11-29 MED ORDER — DIPHENHYDRAMINE HCL 25 MG PO CAPS
25.0000 mg | ORAL_CAPSULE | ORAL | Status: DC | PRN
  Filled 2017-11-29: qty 1

## 2017-11-29 MED ORDER — SODIUM CHLORIDE 0.9% FLUSH: 3.0000 mL | INTRAVENOUS | Status: DC | PRN

## 2017-11-29 MED ORDER — NALOXONE HCL 0.4 MG/ML IJ SOLN: 0.4000 mg | INTRAMUSCULAR | Status: DC | PRN

## 2017-11-29 MED ORDER — SCOPOLAMINE 1 MG/3DAYS TD PT72
MEDICATED_PATCH | TRANSDERMAL | Status: DC | PRN
  Administered 2017-11-29: 1 via TRANSDERMAL

## 2017-11-29 MED ORDER — NALBUPHINE HCL 10 MG/ML IJ SOLN: 5.0000 mg | Freq: Once | INTRAMUSCULAR | Status: DC | PRN

## 2017-11-29 MED ORDER — ONDANSETRON HCL 4 MG/2ML IJ SOLN: 4.0000 mg | Freq: Three times a day (TID) | INTRAMUSCULAR | Status: DC | PRN

## 2017-11-29 MED ORDER — NALBUPHINE HCL 10 MG/ML IJ SOLN: 5.0000 mg | INTRAMUSCULAR | Status: DC | PRN

## 2017-11-29 MED ORDER — ONDANSETRON HCL 4 MG/2ML IJ SOLN
INTRAMUSCULAR | Status: DC | PRN
  Administered 2017-11-29: 4 mg via INTRAVENOUS

## 2017-11-29 MED ORDER — MEPERIDINE HCL 25 MG/ML IJ SOLN: 6.2500 mg | INTRAMUSCULAR | Status: DC | PRN

## 2017-11-29 MED ORDER — ZOLPIDEM TARTRATE 5 MG PO TABS
5.0000 mg | ORAL_TABLET | Freq: Every evening | ORAL | Status: DC | PRN
Start: 2017-11-29 — End: 2017-12-01

## 2017-11-29 MED ORDER — DIBUCAINE 1 % RE OINT
1.0000 "application " | TOPICAL_OINTMENT | RECTAL | Status: DC | PRN
  Filled 2017-11-29: qty 28

## 2017-11-29 MED ORDER — DIPHENHYDRAMINE HCL 25 MG PO CAPS
25.0000 mg | ORAL_CAPSULE | Freq: Four times a day (QID) | ORAL | Status: DC | PRN
  Filled 2017-11-29: qty 1

## 2017-11-29 MED ORDER — OXYTOCIN 10 UNIT/ML IJ SOLN
INTRAMUSCULAR | Status: AC
  Filled 2017-11-29: qty 4

## 2017-11-29 MED ORDER — LACTATED RINGERS IV SOLN
INTRAVENOUS | Status: DC
  Administered 2017-11-29 (×2): via INTRAVENOUS

## 2017-11-29 MED ORDER — FENTANYL CITRATE (PF) 100 MCG/2ML IJ SOLN: 25.0000 ug | INTRAMUSCULAR | Status: DC | PRN

## 2017-11-29 MED ORDER — FENTANYL CITRATE (PF) 100 MCG/2ML IJ SOLN
INTRAMUSCULAR | Status: DC | PRN
  Administered 2017-11-29: 15 ug via INTRATHECAL
  Administered 2017-11-29: 85 ug via INTRAVENOUS

## 2017-11-29 MED ORDER — MORPHINE SULFATE (PF) 0.5 MG/ML IJ SOLN
INTRAMUSCULAR | Status: DC | PRN
  Administered 2017-11-29: .15 mg via INTRATHECAL

## 2017-11-29 MED ORDER — OXYCODONE HCL 5 MG PO TABS
5.0000 mg | ORAL_TABLET | ORAL | Status: DC | PRN
  Administered 2017-11-29 – 2017-11-30 (×3): 5 mg via ORAL
  Filled 2017-11-29 (×3): qty 1

## 2017-11-29 MED ORDER — DIPHENHYDRAMINE HCL 50 MG/ML IJ SOLN: 12.5000 mg | INTRAMUSCULAR | Status: DC | PRN

## 2017-11-29 MED ORDER — PRENATAL MULTIVITAMIN CH
1.0000 | ORAL_TABLET | Freq: Every day | ORAL | Status: DC
  Administered 2017-11-30 – 2017-12-01 (×2): 1 via ORAL
  Filled 2017-11-29 (×4): qty 1

## 2017-11-29 MED ORDER — SENNOSIDES-DOCUSATE SODIUM 8.6-50 MG PO TABS
2.0000 | ORAL_TABLET | ORAL | Status: DC
  Administered 2017-11-29 – 2017-11-30 (×2): 2 via ORAL
  Filled 2017-11-29 (×4): qty 2

## 2017-11-29 MED ORDER — SOD CITRATE-CITRIC ACID 500-334 MG/5ML PO SOLN: 30.0000 mL | Freq: Once | ORAL | Status: DC

## 2017-11-29 MED ORDER — DEXAMETHASONE SODIUM PHOSPHATE 10 MG/ML IJ SOLN
INTRAMUSCULAR | Status: AC
  Filled 2017-11-29: qty 1

## 2017-11-29 MED ORDER — BUPIVACAINE IN DEXTROSE 0.75-8.25 % IT SOLN
INTRATHECAL | Status: DC | PRN
  Administered 2017-11-29: 1.5 mL via INTRATHECAL

## 2017-11-29 MED ORDER — OXYCODONE HCL 5 MG PO TABS
10.0000 mg | ORAL_TABLET | ORAL | Status: DC | PRN
  Administered 2017-11-30 – 2017-12-01 (×6): 10 mg via ORAL
  Filled 2017-11-29 (×7): qty 2

## 2017-11-29 MED ORDER — ONDANSETRON HCL 4 MG/2ML IJ SOLN
INTRAMUSCULAR | Status: AC
Start: 2017-11-29 — End: ?
  Filled 2017-11-29: qty 2

## 2017-11-29 MED ORDER — SCOPOLAMINE 1 MG/3DAYS TD PT72
MEDICATED_PATCH | TRANSDERMAL | Status: AC
Start: 2017-11-29 — End: ?
  Filled 2017-11-29: qty 1

## 2017-11-29 MED ORDER — TETANUS-DIPHTH-ACELL PERTUSSIS 5-2.5-18.5 LF-MCG/0.5 IM SUSP
0.5000 mL | Freq: Once | INTRAMUSCULAR | Status: DC
  Filled 2017-11-29: qty 0.5

## 2017-11-29 MED ORDER — NALOXONE HCL 4 MG/10ML IJ SOLN
1.0000 ug/kg/h | INTRAVENOUS | Status: DC | PRN
  Filled 2017-11-29: qty 5

## 2017-11-29 MED ORDER — PHENYLEPHRINE 40 MCG/ML (10ML) SYRINGE FOR IV PUSH (FOR BLOOD PRESSURE SUPPORT)
PREFILLED_SYRINGE | INTRAVENOUS | Status: AC
  Filled 2017-11-29: qty 10

## 2017-11-29 MED ORDER — COCONUT OIL OIL
1.0000 "application " | TOPICAL_OIL | Status: DC | PRN
  Filled 2017-11-29: qty 120

## 2017-11-29 MED ORDER — CLINDAMYCIN PHOSPHATE 900 MG/50ML IV SOLN: 900.0000 mg | Freq: Once | INTRAVENOUS | Status: DC

## 2017-11-29 MED ORDER — DEXTROSE 5 % IV SOLN
5.0000 mg/kg | Freq: Once | INTRAVENOUS | Status: AC
  Administered 2017-11-29: 620.4 mg via INTRAVENOUS
  Filled 2017-11-29: qty 15.5

## 2017-11-29 MED ORDER — SCOPOLAMINE 1 MG/3DAYS TD PT72: 1.0000 | MEDICATED_PATCH | Freq: Once | TRANSDERMAL | Status: DC

## 2017-11-29 MED ORDER — PHENYLEPHRINE HCL 10 MG/ML IJ SOLN
INTRAMUSCULAR | Status: DC | PRN
  Administered 2017-11-29 (×4): 80 ug via INTRAVENOUS

## 2017-11-29 MED ORDER — MENTHOL 3 MG MT LOZG
1.0000 | LOZENGE | OROMUCOSAL | Status: DC | PRN
  Filled 2017-11-29: qty 9

## 2017-11-29 MED ORDER — ACETAMINOPHEN 325 MG PO TABS
650.0000 mg | ORAL_TABLET | ORAL | Status: DC | PRN
  Administered 2017-11-29 – 2017-12-01 (×6): 650 mg via ORAL
  Filled 2017-11-29 (×6): qty 2

## 2017-11-29 MED ORDER — SIMETHICONE 80 MG PO CHEW
80.0000 mg | CHEWABLE_TABLET | Freq: Three times a day (TID) | ORAL | Status: DC
  Administered 2017-11-29 – 2017-12-01 (×9): 80 mg via ORAL
  Filled 2017-11-29 (×15): qty 1

## 2017-11-29 MED ORDER — TENS THERAPY PAIN RELIEF DEVI: 1.0000 [IU] | Freq: Two times a day (BID) | Status: DC

## 2017-11-29 MED ORDER — ENOXAPARIN SODIUM 60 MG/0.6ML ~~LOC~~ SOLN
60.0000 mg | SUBCUTANEOUS | Status: DC
  Administered 2017-11-30 – 2017-12-01 (×2): 60 mg via SUBCUTANEOUS
  Filled 2017-11-29 (×3): qty 0.6

## 2017-11-29 MED ORDER — OXYTOCIN 10 UNIT/ML IJ SOLN
INTRAVENOUS | Status: DC | PRN
  Administered 2017-11-29: 40 [IU] via INTRAVENOUS

## 2017-11-29 MED ORDER — PHENYLEPHRINE 8 MG IN D5W 100 ML (0.08MG/ML) PREMIX OPTIME
INJECTION | INTRAVENOUS | Status: DC | PRN
  Administered 2017-11-29: 60 ug/min via INTRAVENOUS

## 2017-11-29 MED ORDER — NALOXONE HCL 0.4 MG/ML IJ SOLN
INTRAMUSCULAR | Status: AC
  Filled 2017-11-29: qty 1

## 2017-11-29 MED ORDER — WITCH HAZEL-GLYCERIN EX PADS: 1.0000 "application " | MEDICATED_PAD | CUTANEOUS | Status: DC | PRN

## 2017-11-29 MED ORDER — SIMETHICONE 80 MG PO CHEW
80.0000 mg | CHEWABLE_TABLET | ORAL | Status: DC
  Administered 2017-11-29 – 2017-11-30 (×2): 80 mg via ORAL
  Filled 2017-11-29 (×4): qty 1

## 2017-11-29 MED ORDER — MORPHINE SULFATE (PF) 0.5 MG/ML IJ SOLN
INTRAMUSCULAR | Status: AC
Start: 2017-11-29 — End: ?
  Filled 2017-11-29: qty 10

## 2017-11-29 SURGICAL SUPPLY — 38 items
BENZOIN TINCTURE PRP APPL 2/3 (GAUZE/BANDAGES/DRESSINGS) ×4 IMPLANT
CHLORAPREP W/TINT 26ML (MISCELLANEOUS) ×4 IMPLANT
CLAMP CORD UMBIL (MISCELLANEOUS) IMPLANT
CLIP FILSHIE TUBAL LIGA STRL (Clip) ×4 IMPLANT
CLOSURE WOUND 1/2 X4 (GAUZE/BANDAGES/DRESSINGS) ×1
CLOTH BEACON ORANGE TIMEOUT ST (SAFETY) ×4 IMPLANT
DRSG OPSITE POSTOP 4X10 (GAUZE/BANDAGES/DRESSINGS) ×4 IMPLANT
ELECT REM PT RETURN 9FT ADLT (ELECTROSURGICAL) ×4
ELECTRODE REM PT RTRN 9FT ADLT (ELECTROSURGICAL) ×2 IMPLANT
EXTRACTOR VACUUM KIWI (MISCELLANEOUS) IMPLANT
GLOVE BIOGEL PI IND STRL 7.0 (GLOVE) ×2 IMPLANT
GLOVE BIOGEL PI IND STRL 9 (GLOVE) ×2 IMPLANT
GLOVE BIOGEL PI INDICATOR 7.0 (GLOVE) ×2
GLOVE BIOGEL PI INDICATOR 9 (GLOVE) ×2
GLOVE SS PI 9.0 STRL (GLOVE) ×4 IMPLANT
GOWN STRL REUS W/TWL 2XL LVL3 (GOWN DISPOSABLE) ×4 IMPLANT
GOWN STRL REUS W/TWL LRG LVL3 (GOWN DISPOSABLE) ×4 IMPLANT
HOVERMATT SINGLE USE (MISCELLANEOUS) ×4 IMPLANT
NEEDLE HYPO 25X5/8 SAFETYGLIDE (NEEDLE) IMPLANT
NS IRRIG 1000ML POUR BTL (IV SOLUTION) ×4 IMPLANT
PACK C SECTION WH (CUSTOM PROCEDURE TRAY) ×4 IMPLANT
PAD OB MATERNITY 4.3X12.25 (PERSONAL CARE ITEMS) ×4 IMPLANT
PENCIL SMOKE EVAC W/HOLSTER (ELECTROSURGICAL) ×4 IMPLANT
RETRACTOR TRAXI PANNICULUS (MISCELLANEOUS) ×2 IMPLANT
RTRCTR C-SECT PINK 25CM LRG (MISCELLANEOUS) IMPLANT
RTRCTR C-SECT PINK 34CM XLRG (MISCELLANEOUS) IMPLANT
STRIP CLOSURE SKIN 1/2X4 (GAUZE/BANDAGES/DRESSINGS) ×3 IMPLANT
SUT MNCRL 0 VIOLET CTX 36 (SUTURE) ×4 IMPLANT
SUT MONOCRYL 0 CTX 36 (SUTURE) ×4
SUT VIC AB 0 CT1 27 (SUTURE) ×2
SUT VIC AB 0 CT1 27XBRD ANBCTR (SUTURE) ×2 IMPLANT
SUT VIC AB 2-0 CT1 27 (SUTURE) ×2
SUT VIC AB 2-0 CT1 TAPERPNT 27 (SUTURE) ×2 IMPLANT
SUT VIC AB 4-0 KS 27 (SUTURE) ×4 IMPLANT
SYR BULB IRRIGATION 50ML (SYRINGE) IMPLANT
TOWEL OR 17X24 6PK STRL BLUE (TOWEL DISPOSABLE) ×4 IMPLANT
TRAXI PANNICULUS RETRACTOR (MISCELLANEOUS) ×2
TRAY FOLEY W/BAG SLVR 14FR LF (SET/KITS/TRAYS/PACK) ×4 IMPLANT

## 2017-11-29 NOTE — Anesthesia Procedure Notes (Signed)
Spinal  Patient location during procedure: OR Start time: 11/29/2017 5:27 AM Staffing Anesthesiologist: Mal AmabileFoster, Herlinda Heady, MD Performed: anesthesiologist  Preanesthetic Checklist Completed: patient identified, site marked, surgical consent, pre-op evaluation, timeout performed, IV checked, risks and benefits discussed and monitors and equipment checked Spinal Block Patient position: sitting Prep: site prepped and draped and DuraPrep Patient monitoring: heart rate, cardiac monitor, continuous pulse ox and blood pressure Approach: midline Location: L3-4 Injection technique: single-shot Needle Needle type: Pencan  Needle gauge: 24 G Needle length: 9 cm Needle insertion depth: 7 cm Assessment Sensory level: T4 Additional Notes Patient tolerated procedure well. Adequate sensory level.

## 2017-11-29 NOTE — Lactation Note (Signed)
This note was copied from a baby's chart. Lactation Consultation Note  Patient Name: Brenda Silvestre MomentBrandy Kennan WUJWJ'XToday's Date: 11/29/2017 Reason for consult: Initial assessment(pump and bottlefeed only)  Patient with visitors in room.  Asked patient if it was okay to discuss breastfeeding with visitors present.  She responded yes.  Patient reports she exclusively Pumped breastmilk for 111/2 months for her last infant.  Would like to be able to pump that long or longer with this baby.  Patient reports she has electric breastpump for home use.  Discussed adding massage and hand expression to pumping to get a good breastmilk supply.  Discussed pumping 8-12 times day to establish good breastmilk supply.  Urged to pump every time baby eats.   Reviewed breastmilk storage guidelines.  Patient has breastfeeding resources for going home and information about BFSG.  Maternal Data Has patient been taught Hand Expression?: Yes(patient reports she knows how to hand express) Does the patient have breastfeeding experience prior to this delivery?: No(breast pump only/did not bf with first)  Feeding Feeding Type: Breast Milk Nipple Type: Slow - flow  LATCH Score                   Interventions Interventions: Breast massage  Lactation Tools Discussed/Used Pump Review: Milk Storage Initiated by:: RN inittiated pumping   Consult Status Consult Status: Follow-up Date: 11/30/17 Follow-up type: In-patient    Loreley Schwall Michaelle CopasS Dorene Bruni 11/29/2017, 3:09 PM

## 2017-11-29 NOTE — Consult Note (Signed)
Neonatology Note:   Attendance at C-section:    I was asked by Dr. Emelda FearFerguson to attend this repeat C/S at term after TOLAC. The mother is a G2P1 A pos, GBS neg with morbid obesity and multiple allergies. ROM at delivery, fluid clear. Infant did not cry after birth and delayed cord clamping had to be curtailed after about 20 seconds. Infant dusky and floppy. We did bulb suctioning, then gave stimulation. The baby's HR was normal throughout; he would cry once or twice, then go apneic again without constant stimulation. On further questioning, we discovered that mother had gotten 2 doses of IV Fentanyl since midnight, the last about 3 hours prior to delivery. We gave 1 ml Narcan IM to the left thigh at about 5-6 minutes of life, with prompt resolution of respiratory irregularity. We gave BBO2 and he pinked up nicely. Lungs were clear with good air entry, but he developed subcostal retractions (no grunting). Ap 4/5/9. O2 saturation in room air was 95% despite retractions. NICU critically full, so I asked the baby's CN nurse to take the father of the baby with the baby to the CN, place baby skin to skin with the father (upright) with pulse oximeter attached,for observation during transition. I instructed her to call me if the baby does not maintain normal O2 sats or has increased work of breathing. I will check back on this infant, also.   Doretha Souhristie C. Delcie Ruppert, MD

## 2017-11-29 NOTE — Transfer of Care (Signed)
Immediate Anesthesia Transfer of Care Note  Patient: Brenda Gutierrez  Procedure(s) Performed: CESAREAN SECTION (N/A Abdomen)  Patient Location: PACU  Anesthesia Type:Spinal  Level of Consciousness: awake and alert   Airway & Oxygen Therapy: Patient Spontanous Breathing  Post-op Assessment: Report given to RN and Post -op Vital signs reviewed and stable  Post vital signs: Reviewed  Last Vitals:  Vitals Value Taken Time  BP    Temp    Pulse 68 11/29/2017  7:02 AM  Resp 13 11/29/2017  7:02 AM  SpO2 98 % 11/29/2017  7:02 AM  Vitals shown include unvalidated device data.  Last Pain:  Vitals:   11/29/17 0305  TempSrc: Oral  PainSc: 0-No pain      Patients Stated Pain Goal: 9 (11/28/17 0747)  Complications: No apparent anesthesia complications

## 2017-11-29 NOTE — Anesthesia Postprocedure Evaluation (Signed)
Anesthesia Post Note  Patient: Brenda Gutierrez  Procedure(s) Performed: CESAREAN SECTION (N/A Abdomen) BILATERAL TUBAL LIGATION (Bilateral Abdomen)     Patient location during evaluation: PACU Anesthesia Type: Spinal Level of consciousness: oriented and awake and alert Pain management: pain level controlled Vital Signs Assessment: post-procedure vital signs reviewed and stable Respiratory status: spontaneous breathing, respiratory function stable and patient connected to nasal cannula oxygen Cardiovascular status: blood pressure returned to baseline and stable Postop Assessment: no headache, no backache, no apparent nausea or vomiting, spinal receding and patient able to bend at knees Anesthetic complications: no    Last Vitals:  Vitals:   11/29/17 0745 11/29/17 0800  BP: 109/78 120/72  Pulse: 72 69  Resp: (!) 21 13  Temp:    SpO2: 97% 99%    Last Pain:  Vitals:   11/29/17 0800  TempSrc:   PainSc: 0-No pain   Pain Goal: Patients Stated Pain Goal: 9 (11/28/17 0747)               Shelton SilvasKevin D Jacaria Colburn

## 2017-11-29 NOTE — Op Note (Signed)
Please see the brief operative note for surgical details regarding repeat cesarean section for failed TOLAC and bilateral tubal sterilization

## 2017-11-29 NOTE — Progress Notes (Signed)
Pt requesting repeat C/S due to exhaustion. Dr. Emelda FearFerguson notified of pt's request.

## 2017-11-29 NOTE — Op Note (Signed)
11/29/2017  6:51 AM  PATIENT:  Brenda Gutierrez  32 y.o. female  PRE-OPERATIVE DIAGNOSIS:  Fetal Intolerence of labor; Maternal Exhaustion;Failed TOLAC  POST-OPERATIVE DIAGNOSIS:  Fetal Intolerence of labor; Maternal Exhaustion;Failed TOLAC Elective permanent sterlilization PROCEDURE:  Procedure(s): CESAREAN SECTION (N/A), tubal ligation filshie clips/  SURGEON:  Surgeon(s) and Role:    * Tilda Burrow, MD - Primary  PHYSICIAN ASSISTANT:   ASSISTANTS: none   ANESTHESIA:   spinal  EBL:  1000 mL -1100 mL  BLOOD ADMINISTERED:none  DRAINS: Urinary Catheter (Foley)   LOCAL MEDICATIONS USED:  NONE  SPECIMEN:  Source of Specimen:  placenta to L&D  DISPOSITION OF SPECIMEN:  PATHOLOGY  COUNTS:  YES  TOURNIQUET:  * No tourniquets in log *  DICTATION: .Dragon Dictation  PLAN OF CARE: has admission  PATIENT DISPOSITION:  PACU - hemodynamically stable.   Delay start of Pharmacological VTE agent (>24hrs) due to surgical blood loss or risk of bleeding: not applicable Details of procedure.  Patient was taken operating prepped and draped in the usual fashion for lower abdominal surgery timeout was conducted.  Abdomen was prepped and draped.  Procedure confirmed by surgical team.  The patient then went on to state that she wanted her tubes tied and that she had wanted them done all along since she found that she was pregnant..  Husband confirmed with the patient's position and supported her she requested that we try to see if we were able.  She has insurance that allows independent patient decisions so we proceeded with confirming that she understand the permanency of the procedure and that that was indeed an long-standing position and that she was unwavering in her desire.  She then was brought tubal sterilization forms which were signed as we initiated the cesarean section portion of the surgery. Transverse lower abdominal incision was made along the old C-section scar, sharply  dissected down to the fascia which was opened transversely and then dissected off the rectus muscles without difficulty.  Midline peritoneal cavity entry was performed and then the bladder flap dissected free.  Alexis wound retractor was positioned.  Transverse uterine incision was made extended transversely then cephalad and caudad, and the fetal vertex delivered easily through the incision.  Amniotic fluid was clear and generous.  There was a large varicosity that was torn into on the right side of the incision and we quickly got a ring forceps on this portion of the incision.  Cord was clamped at less than 1 minute because the baby did not respond well to tactile stimulation remained with diminished tone.  Pediatricians notes her addressing this further care.  The baby ended up going to the nursery a little bit early more so than normal.  The uterus responded with good tone and had expulsion of the placenta and membranes intact.  Uterus was swabbed out.  The ring forceps were removed from the uterine closure performed using a running locking first layer.  There was an inferior tear toward the lower uterine segment about 2 cm in length about 1 cm from the left hand of the transverse incision and this was sewn together with a running locking closure and incorporated into the transverse uterine incision first layer closure.  Second layer was a continuous running 0 Monocryl closure.  There was horizontal mattress suture required on the inferior aspects of the incision to complete hemostasis this was placed close to the midline.  Hemostasis was confirmed and pelvis swabbed out. Tubal sterilization Filshie clips were  then placed on each fallopian tube in the midportion of the tube with good tissue position confirmation.  2-0 Vicryl followed by subcuticular 4-0 Vicryl closure of the skin incision sponge and needle counts were correct and patient to recovery room in good condition

## 2017-11-29 NOTE — Progress Notes (Signed)
Brenda Gutierrez is a 32 y.o. G2P1001 at 8014w1d  admitted for induction of labor due to Elective at term and TOLAC.pt was having intolerable pelvic pain, .  Subjective: Pt has been on pitocin at low to intermediate dosing thru the night, we have been unable to go on higher dosing of pitocin without fetal heart rate decels. Beat to beat excellent throughout.  Objective: BP 122/76   Pulse (!) 56   Temp 97.8 F (36.6 C) (Oral)   Resp 18   Ht 5\' 4"  (1.626 m)   Wt 124.1 kg   LMP 02/28/2017 (Exact Date)   BMI 46.95 kg/m  No intake/output data recorded. Total I/O In: 3237.1 [I.V.:3237.1] Out: -   FHT:  FHR: 145 bpm, variability: moderate,  accelerations:  Present,  decelerations:  Present early  / variable UC:   irregular, every 5 minutes SVE:   Dilation: Fingertip Effacement (%): Thick Station: -3 Exam by:: Brenda GrantMadison Hicks, RN   Labs: Lab Results  Component Value Date   WBC 14.7 (H) 11/28/2017   HGB 12.6 11/28/2017   HCT 38.4 11/28/2017   MCV 84.0 11/28/2017   PLT 268 11/28/2017    Assessment / Plan: pt exhaustion with labor process,2 fetal intolerance of higher dosing of pitocin.  Labor: no progress , cervix remains unchanged from earlier exams. Preeclampsia:   Fetal Wellbeing:  Category I Pain Control:  Labor support without medications I/D:  n/a Anticipated MOD:  pt desires to proceed to cesarean section, Alternatives reviewed. to OR for repeat c/s for failed tolac. Risks such as transfusion, injury to adjacent organs, infection, all reviewed with pt and understanding acknowledged, pitocin d/c'd and to OR promptly. Tilda BurrowJohn V Jullian Clayson 11/29/2017, 5:26 AM

## 2017-11-29 NOTE — Progress Notes (Signed)
PHARMACIST - PHYSICIAN ORDER COMMUNICATION  CONCERNING: P&T Medication Policy on Herbal Medications  DESCRIPTION:  This patient's order for:  TENS THERAPY  has been noted.  This product(s) is classified as an "herbal" or natural product. Due to a lack of definitive safety studies or FDA approval, nonstandard manufacturing practices, plus the potential risk of unknown drug-drug interactions while on inpatient medications, the Pharmacy and Therapeutics Committee does not permit the use of "herbal" or natural products of this type within [facility].   ACTION TAKEN: The pharmacy department is unable to verify this order at this time and your patient has been informed of this safety policy. Please reevaluate patient's clinical condition at discharge and address if the herbal or natural product(s) should be resumed at that time.

## 2017-11-29 NOTE — Anesthesia Preprocedure Evaluation (Signed)
Anesthesia Evaluation  Patient identified by MRN, date of birth, ID band Patient awake    Reviewed: Allergy & Precautions, NPO status , Patient's Chart, lab work & pertinent test results  Airway Mallampati: III  TM Distance: >3 FB Neck ROM: Full    Dental no notable dental hx. (+) Teeth Intact   Pulmonary former smoker,    Pulmonary exam normal breath sounds clear to auscultation       Cardiovascular negative cardio ROS Normal cardiovascular exam Rhythm:Regular Rate:Normal     Neuro/Psych  Headaches, negative psych ROS   GI/Hepatic Neg liver ROS, GERD  ,  Endo/Other  Morbid obesity  Renal/GU negative Renal ROS  negative genitourinary   Musculoskeletal Right hip pain   Abdominal (+) + obese,   Peds  Hematology   Anesthesia Other Findings   Reproductive/Obstetrics (+) Pregnancy Previous C/Section Failed Induction                             Anesthesia Physical Anesthesia Plan  ASA: III and emergent  Anesthesia Plan: Spinal   Post-op Pain Management:    Induction:   PONV Risk Score and Plan: 4 or greater and Scopolamine patch - Pre-op, Dexamethasone, Ondansetron and Treatment may vary due to age or medical condition  Airway Management Planned: Natural Airway  Additional Equipment:   Intra-op Plan:   Post-operative Plan:   Informed Consent: I have reviewed the patients History and Physical, chart, labs and discussed the procedure including the risks, benefits and alternatives for the proposed anesthesia with the patient or authorized representative who has indicated his/her understanding and acceptance.     Plan Discussed with: CRNA and Surgeon  Anesthesia Plan Comments:         Anesthesia Quick Evaluation

## 2017-11-30 LAB — CBC
HEMATOCRIT: 26 % — AB (ref 36.0–46.0)
Hemoglobin: 8.7 g/dL — ABNORMAL LOW (ref 12.0–15.0)
MCH: 28.3 pg (ref 26.0–34.0)
MCHC: 33.5 g/dL (ref 30.0–36.0)
MCV: 84.7 fL (ref 78.0–100.0)
Platelets: 206 10*3/uL (ref 150–400)
RBC: 3.07 MIL/uL — ABNORMAL LOW (ref 3.87–5.11)
RDW: 15.3 % (ref 11.5–15.5)
WBC: 14.6 10*3/uL — ABNORMAL HIGH (ref 4.0–10.5)

## 2017-11-30 NOTE — Progress Notes (Signed)
Subjective: Postpartum Day 1: Cesarean Delivery Patient reports incisional pain, tolerating PO, + flatus and no problems voiding.    Objective: Vital signs in last 24 hours: Temp:  [97.9 F (36.6 C)-98.4 F (36.9 C)] 98.2 F (36.8 C) (08/11 0430) Pulse Rate:  [56-77] 61 (08/11 0430) Resp:  [18] 18 (08/11 0430) BP: (107-134)/(62-86) 107/65 (08/11 0430) SpO2:  [97 %-99 %] 99 % (08/11 0430)  Physical Exam:  General: alert, cooperative and no distress Lochia: appropriate Uterine Fundus: firm Incision: no significant drainage DVT Evaluation: No evidence of DVT seen on physical exam.  Recent Labs    11/28/17 0735 11/30/17 0637  HGB 12.6 8.7*  HCT 38.4 26.0*    Assessment/Plan: Status post Cesarean section. Doing well postoperatively.  Continue current care.  Sharen CounterLisa Leftwich-Kirby 11/30/2017, 8:57 AM

## 2017-11-30 NOTE — Plan of Care (Signed)
Pt is progressing well. Pt is voiding and ambulating well.

## 2017-12-01 ENCOUNTER — Encounter (HOSPITAL_COMMUNITY): Payer: Self-pay

## 2017-12-01 MED ORDER — OXYCODONE HCL 5 MG PO TABS: 5.0000 mg | ORAL_TABLET | ORAL | 0 refills | Status: DC | PRN

## 2017-12-01 MED ORDER — IBUPROFEN 600 MG PO TABS: 600.0000 mg | ORAL_TABLET | Freq: Four times a day (QID) | ORAL | 0 refills | Status: DC

## 2017-12-01 MED ORDER — SENNOSIDES-DOCUSATE SODIUM 8.6-50 MG PO TABS: 2.0000 | ORAL_TABLET | ORAL | 0 refills | Status: DC

## 2017-12-01 NOTE — Progress Notes (Signed)
Dr. Earlene PlaterWallace to see patient for discharge

## 2017-12-01 NOTE — Discharge Instructions (Signed)

## 2017-12-01 NOTE — Progress Notes (Signed)
Spoke with attending MD for discharge.

## 2017-12-01 NOTE — Progress Notes (Signed)
2 Days Post-Op Procedure(s) (LRB): CESAREAN SECTION (N/A) BILATERAL TUBAL LIGATION (Bilateral)  Subjective: Patient reports tolerating PO, + flatus, + BM and no problems voiding.    Objective: I have reviewed patient's vital signs, intake and output, medications, labs, microbiology and pathology.  General: alert, cooperative, appears stated age and no distress Resp: clear to auscultation bilaterally Cardio: regular rate and rhythm, S1, S2 normal, no murmur, click, rub or gallop Extremities: extremities normal, atraumatic, no cyanosis or edema Vaginal Bleeding: minimal  Assessment: s/p Procedure(s) with comments: CESAREAN SECTION (N/A) BILATERAL TUBAL LIGATION (Bilateral) - Filshie Clips: stable, progressing well and tolerating diet  Plan: Encourage ambulation  LOS: 3 days    Brenda Gutierrez, CNM 12/01/2017, 7:36 AM

## 2017-12-01 NOTE — Progress Notes (Deleted)
Subjective: Postpartum Day 2: Cesarean Delivery Patient reports tolerating PO, + flatus and + BM.    Objective: Vital signs in last 24 hours: Temp:  [97.5 F (36.4 C)-98 F (36.7 C)] 97.8 F (36.6 C) (08/12 0511) Pulse Rate:  [64-87] 81 (08/12 0511) Resp:  [16-19] 18 (08/12 0511) BP: (115-131)/(67-77) 131/67 (08/12 0511) SpO2:  [99 %] 99 % (08/12 0511)  Physical Exam:  General: alert, cooperative, appears stated age and no distress Lochia: appropriate Uterine Fundus: firm Incision: healing well, no significant drainage, no dehiscence, no significant erythema DVT Evaluation: No evidence of DVT seen on physical exam.  Recent Labs    11/30/17 0637  HGB 8.7*  HCT 26.0*    Assessment/Plan: Status post Cesarean section. Doing well postoperatively.  Continue current care.  Calvert CantorSamantha C Weinhold, CNM 12/01/2017, 8:59 AM

## 2017-12-01 NOTE — Discharge Summary (Signed)
OB Discharge Summary     Patient Name: Brenda Gutierrez DOB: [birth year] MRN: [id number]  Date of admission: 11/28/2017 Delivering MD: Tilda BurrowFERGUSON, JOHN V   Date of discharge: 12/01/2017  Admitting diagnosis: INDUCTION Intrauterine pregnancy: 9141w1d     Secondary diagnosis:  Active Problems:   Encounter for elective induction of labor   Request for sterilization  Additional problems: Failed TOLAC      Discharge diagnosis: Term Pregnancy Delivered                                                                                                Post partum procedures:postpartum tubal ligation  Augmentation: Cytotec  Complications: None  Hospital course:  Induction of Labor With Cesarean Section  32 y.o. yo G2P2002 at 6241w1d was admitted to the hospital 11/28/2017 for induction of labor. Patient had a labor course significant for failure to progress. The patient went for cesarean section due to Arrest of Dilation, and delivered a Viable infant,11/29/2017  Membrane Rupture Time/Date: 6:04 AM ,11/29/2017   Details of operation can be found in separate operative Note.  Patient had an uncomplicated postpartum course. She is ambulating, tolerating a regular diet, passing flatus, and urinating well.  Patient is discharged home in stable condition on 12/01/17.                                    Physical exam  Vitals:   11/30/17 1400 11/30/17 1541 11/30/17 2339 12/01/17 0511  BP:  115/77 118/75 131/67  Pulse: 64 87 79 81  Resp: 19 16 17 18   Temp:  (!) 97.5 F (36.4 C) 98 F (36.7 C) 97.8 F (36.6 C)  TempSrc:  Oral  Oral  SpO2: 99%  99% 99%  Weight:      Height:       General: alert and cooperative Lochia: appropriate Uterine Fundus: firm Incision: Dressing is clean, dry, and intact DVT Evaluation: No evidence of DVT seen on physical exam. Labs: Lab Results  Component Value Date   WBC 14.6 (H) 11/30/2017   HGB 8.7 (L) 11/30/2017   HCT 26.0 (L) 11/30/2017   MCV 84.7 11/30/2017   PLT 206 11/30/2017   CMP Latest Ref Rng & Units 06/26/2015  Glucose 65 - 99 mg/dL 92  BUN 6 - 20 mg/dL 6  Creatinine 4.090.44 - 8.111.00 mg/dL 9.14(N0.39(L)  Sodium 829135 - 562145 mmol/L 136  Potassium 3.5 - 5.1 mmol/L 3.9  Chloride 101 - 111 mmol/L 108  CO2 22 - 32 mmol/L 19(L)  Calcium 8.9 - 10.3 mg/dL 1.3(Y8.8(L)  Total Protein 6.5 - 8.1 g/dL 6.4(L)  Total Bilirubin 0.3 - 1.2 mg/dL 0.6  Alkaline Phos 38 - 126 U/L 89  AST 15 - 41 U/L 18  ALT 14 - 54 U/L 15    Discharge instruction: per After Visit Summary and "Baby and Me Booklet".  After visit meds:  Allergies as of 12/01/2017      Reactions   Bee Pollen Anaphylaxis   Cherry Flavor [flavoring Agent] Anaphylaxis  Insect Extract Allergy Skin Test Anaphylaxis   Penicillin G Anaphylaxis   Penicillins Anaphylaxis   Has patient had a PCN reaction causing immediate rash, facial/tongue/throat swelling, SOB or lightheadedness with hypotension: Yes Has patient had a PCN reaction causing severe rash involving mucus membranes or skin necrosis: Yes Has patient had a PCN reaction that required hospitalization: No Has patient had a PCN reaction occurring within the last 10 years: Yes If all of the above answers are "NO", then may proceed with Cephalosporin use.   Adhesive [tape]    Amoxicillin-pot Clavulanate Hives   Red Dye Other (See Comments)   Gi upset   Latex Rash      Medication List    TAKE these medications   acetaminophen 500 MG tablet Commonly known as:  TYLENOL Take 1,000 mg by mouth every 6 (six) hours as needed for mild pain.   GAS-X PO Take 2 tablets by mouth 2 (two) times daily as needed (for gas/heartburn).   ibuprofen 600 MG tablet Commonly known as:  ADVIL,MOTRIN Take 1 tablet (600 mg total) by mouth every 6 (six) hours.   multivitamin-prenatal 27-0.8 MG Tabs tablet Take 1 tablet by mouth daily at 12 noon.   oxyCODONE 5 MG immediate release tablet Commonly known as:  Oxy IR/ROXICODONE Take 1 tablet (5 mg total) by mouth every 4  (four) hours as needed (pain scale 4-7).   ranitidine 150 MG tablet Commonly known as:  ZANTAC Take 150 mg by mouth 2 (two) times daily.   senna-docusate 8.6-50 MG tablet Commonly known as:  Senokot-S Take 2 tablets by mouth daily. Start taking on:  12/02/2017   TENS THERAPY PAIN RELIEF Devi 1 Units by Does not apply route 2 (two) times daily.       Diet: routine diet  Activity: Advance as tolerated. Pelvic rest for 6 weeks.   Outpatient follow up:4 weeks Follow up Appt: Future Appointments  Date Time Provider Department Center  12/29/2017  2:30 PM Reva BoresPratt, Tanya S, MD [facility]   Follow up Visit:No follow-ups on file.  Postpartum contraception: Tubal Ligation  Newborn Data: Live born female  Birth Weight: 7 lb 7.4 oz (3385 g) APGAR: 4, 5  Newborn Delivery   Birth date/time:  11/29/2017 06:04:00 Delivery type:  C-Section, Low Vertical Trial of labor:  Yes C-section categorization:  Repeat     Baby Feeding: Breast Disposition:home with mother   12/01/2017 De Hollingsheadatherine L Edvin Albus, DO

## 2017-12-01 NOTE — Progress Notes (Signed)
Discharge teaching complete. Pt given AVS and verbalized understanding of discharge instructions/follow up symptoms to report. Denies any further needs

## 2017-12-02 NOTE — Addendum Note (Signed)
Addendum  created 12/02/17 1227 by Mal AmabileFoster, Mohamedamin Nifong, MD   Attestation recorded in [place] Attestations filed

## 2017-12-10 ENCOUNTER — Ambulatory Visit (INDEPENDENT_AMBULATORY_CARE_PROVIDER_SITE_OTHER): Payer: 59 | Admitting: Student

## 2017-12-10 ENCOUNTER — Encounter: Payer: Self-pay | Admitting: Student

## 2017-12-10 DIAGNOSIS — L03314 Cellulitis of groin: Secondary | ICD-10-CM

## 2017-12-10 DIAGNOSIS — L0390 Cellulitis, unspecified: Secondary | ICD-10-CM | POA: Insufficient documentation

## 2017-12-10 MED ORDER — SULFAMETHOXAZOLE-TRIMETHOPRIM 800-160 MG PO TABS: 1.0000 | ORAL_TABLET | Freq: Two times a day (BID) | ORAL | 0 refills | Status: AC

## 2017-12-10 NOTE — Progress Notes (Signed)
  Subjective:     Patient ID: Brenda Gutierrez, female   DOB: 05/14/1985, 32 y.o.   MRN: [id number]  HPI Patient is 11 days postpartum; s/p repeat C-section. She is concerned because yesterday she noticed a little drainage and redness on the right side of her incision. She also felt some pain, but it has resolved. She denies fever, body aches, chills.   Review of Systems  Constitutional: Negative.   HENT: Negative.   Respiratory: Negative.   Cardiovascular: Negative.   Gastrointestinal: Positive for abdominal pain.  Genitourinary: Negative.   Musculoskeletal: Negative.   Neurological: Negative.   Hematological: Negative.   Psychiatric/Behavioral: Negative.        Objective:   Physical Exam  Constitutional: She is oriented to person, place, and time. She appears well-developed.  HENT:  Head: Normocephalic.  Neck: Normal range of motion.  Pulmonary/Chest: Effort normal.  Abdominal: Soft. She exhibits no distension.  Musculoskeletal: Normal range of motion.  Neurological: She is alert and oriented to person, place, and time.  Skin: Skin is warm and dry.   Incision mostly well-healed. Right side of incision with redness around the edges, small amount of clear yellow pus oozing from site but no obvious opening or tunneling. Slight tenderness to palpation. No odor.      Assessment:     1. Cellulitis of groin        Plan:     1. Due to patient's high PCN allergry (hives, difficulty breathing), patient to be given Bactrim. Discussed with pharmacy; Bactrim as first line and will re-assess if medication not working. Patient instructed to apply Bacitracin to wound; clean and dry; try to keep pannus off of wound. Monitor for any signs of infection.  2. Reassess in two days for RN visit and to have provider reconsider medications or possible silver nitrate treatment.      Patient verbalized understanding. Agrees with plan of care.   Luna KitchensKathryn Kooistra

## 2017-12-12 ENCOUNTER — Encounter: Payer: Self-pay | Admitting: Family Medicine

## 2017-12-12 ENCOUNTER — Ambulatory Visit (INDEPENDENT_AMBULATORY_CARE_PROVIDER_SITE_OTHER): Payer: 59 | Admitting: Family Medicine

## 2017-12-12 VITALS — BP 110/72 | HR 72

## 2017-12-12 DIAGNOSIS — O8602 Infection of obstetric surgical wound, deep incisional site: Secondary | ICD-10-CM

## 2017-12-12 DIAGNOSIS — T8130XD Disruption of wound, unspecified, subsequent encounter: Secondary | ICD-10-CM

## 2017-12-12 NOTE — Progress Notes (Signed)
Subjective:     Brenda Gutierrez is a 32 y.o. female who presents to the clinic for incision check. She is status post 13 days postpartum cesaration. Eating a regular diet with difficulty. Bowel movements are normal.    Review of Systems   Objective:    BP 110/72   Pulse 72   LMP 02/28/2017 (Exact Date)  General:   normal apperance  Abdomen: {post op abd exam:soft bowel sounds active non-tender  Incision:   incision:no dehiscence incision well approximated healing well,no drainage small amount erythema no hernia no seroma no swelling     Assessment:    Doing well  Plan:    1. Continue any current medications. 2. Wound care discussed. 3. Activity restrictions: Yes 4. Anticipated return to work: 6-8 weeks. 5. Follow up:  12/26/2017  Brenda Gutierrez, CMA

## 2017-12-12 NOTE — Progress Notes (Signed)
   Subjective:    Patient ID: Brenda Gutierrez is a 32 y.o. female presenting with Wound Check  on 12/12/2017  HPI: 11 days pp with some wound issues. Started on Sulfa antibiotics 2 days ago. No fever. Pain that was present during pregnancy has resolved.  Review of Systems  Constitutional: Negative for chills and fever.  Respiratory: Negative for shortness of breath.   Cardiovascular: Negative for chest pain.  Gastrointestinal: Negative for abdominal pain, nausea and vomiting.  Genitourinary: Negative for dysuria.  Skin: Negative for rash.      Objective:    BP 110/72   Pulse 72   LMP 02/28/2017 (Exact Date)  Physical Exam  Constitutional: She is oriented to person, place, and time. She appears well-developed and well-nourished. No distress.  HENT:  Head: Normocephalic and atraumatic.  Eyes: No scleral icterus.  Neck: Neck supple.  Cardiovascular: Normal rate.  Pulmonary/Chest: Effort normal.  Abdominal: Soft.  Neurological: She is alert and oriented to person, place, and time.  Skin: Skin is warm and dry.  Minimal erythema, area treated with AgNO3.  Psychiatric: She has a normal mood and affect.        Assessment & Plan:  Wound disruption, subsequent encounter - improved erythema, return for recheck and possible retreatment with AgNO3 if needed. Continue Bactrim.   Total face-to-face time with patient: 10 minutes. Over 50% of encounter was spent on counseling and coordination of care. Return in about 1 week (around 12/19/2017).  Reva Boresanya S Tekeya Geffert 12/12/2017 9:50 AM

## 2017-12-12 NOTE — Patient Instructions (Signed)

## 2017-12-18 ENCOUNTER — Ambulatory Visit (INDEPENDENT_AMBULATORY_CARE_PROVIDER_SITE_OTHER): Payer: Self-pay

## 2017-12-18 VITALS — BP 114/76 | HR 96 | Resp 18 | Ht 64.0 in | Wt 249.0 lb

## 2017-12-18 DIAGNOSIS — T8130XD Disruption of wound, unspecified, subsequent encounter: Secondary | ICD-10-CM

## 2017-12-18 NOTE — Progress Notes (Signed)
Subjective:     Brenda Gutierrez is a 32 y.o. female who presents to the clinic 2 weeks status post c section for pregnancy. Eating a regular diet without difficulty. Bowel movements are normal. The patient is not having any pain.  The following portions of the patient's history were reviewed and updated as appropriate: allergies and current medications.  Review of Systems Pertinent items noted in HPI and remainder of comprehensive ROS otherwise negative.    Objective:    LMP 02/28/2017 (Exact Date)  General:  alert, cooperative, appears stated age and mild distress due to baby has increased his eating from 2-3 hours to every 45 minutes to an hour.  Abdomen: soft, bowel sounds active, no abnormal masses  Incision:   healing well, no hernia, no seroma, no swelling, well approximated, no dehiscence     Assessment:    Doing well postoperatively.   Plan:    1. Continue any current medications. 2. Wound care discussed. 3. Activity restrictions: none 4. Anticipated return to work: not applicable. 5. Follow up: as needed.   Brenda Gutierrez, Brenda Gutierrez, [place]MA

## 2017-12-19 ENCOUNTER — Ambulatory Visit: Payer: 59

## 2017-12-24 NOTE — Progress Notes (Signed)
I have reviewed the chart and agree with nursing staff's documentation of this patient's encounter.  Cecil Vandyke, MD 12/24/2017 11:46 AM    

## 2017-12-29 ENCOUNTER — Ambulatory Visit (INDEPENDENT_AMBULATORY_CARE_PROVIDER_SITE_OTHER): Payer: 59 | Admitting: Family Medicine

## 2017-12-29 ENCOUNTER — Encounter: Payer: Self-pay | Admitting: Family Medicine

## 2017-12-29 NOTE — Progress Notes (Signed)
Post Partum Exam  Brenda Gutierrez is a 32 y.o. G109P2002 female who presents for a postpartum visit. She is 5 weeks postpartum following a   c-section delivery on 11/29/2017. I have fully reviewed the prenatal and intrapartum course. The delivery was at 39 gestational weeks. RCS done Anesthesia: Epidual. Postpartum course has been uncomplicated. Baby's course has been uncomplicated. Baby is feeding by breast. She is pumping exclusively and has some infrequent pain in the left breast, not associated with erythema, fever, or deep pain. Bleeding not at this time. Bowel function is normal. Bladder function is normal. Patient is not sexually active. Contraception method is tubal ligation. Postpartum depression screening:neg I have reviewed above and edited as necessary. Agree with this documentation.  The following portions of the patient's history were reviewed and updated as appropriate: allergies, current medications, past family history, past medical history, past social history, past surgical history and problem list. Last pap smear done 01/20/2015 and was Normal  Review of Systems A comprehensive review of systems was negative.    Objective:  Last menstrual period 02/28/2017, currently breastfeeding.  General:  alert, cooperative and appears stated age  Lungs: normal effort  Heart:  regular rate and rhythm  Abdomen: soft, non-tender; bowel sounds normal; no masses,  no organomegaly        Assessment:    Nml postpartum exam. Pap smear not done at today's visit.   Plan:   1. Contraception: tubal ligation 2. Needs pap--would no stay to do today 3. Follow up in: 2 weeks for pap or as needed.

## 2018-01-14 ENCOUNTER — Encounter: Payer: Self-pay | Admitting: Family Medicine

## 2018-01-14 ENCOUNTER — Ambulatory Visit (INDEPENDENT_AMBULATORY_CARE_PROVIDER_SITE_OTHER): Payer: 59 | Admitting: Family Medicine

## 2018-01-14 VITALS — BP 115/76 | HR 80 | Ht 64.0 in | Wt 243.0 lb

## 2018-01-14 DIAGNOSIS — Z124 Encounter for screening for malignant neoplasm of cervix: Secondary | ICD-10-CM

## 2018-01-14 DIAGNOSIS — Z1151 Encounter for screening for human papillomavirus (HPV): Secondary | ICD-10-CM | POA: Diagnosis not present

## 2018-01-14 DIAGNOSIS — Z01419 Encounter for gynecological examination (general) (routine) without abnormal findings: Secondary | ICD-10-CM

## 2018-01-14 NOTE — Progress Notes (Signed)
Last PAP: 01/20/2015 - Abnormal Due PAP Influenza vaccine?

## 2018-01-14 NOTE — Progress Notes (Signed)
   GYNECOLOGY ANNUAL PREVENTATIVE CARE ENCOUNTER NOTE  Subjective:   Brenda Gutierrez is a 32 y.o. G66P2002 female here for a a problem GYN visit.  Current complaints: none.   Denies abnormal vaginal bleeding, discharge, pelvic pain, problems with intercourse or other gynecologic concerns.    Gynecologic History No LMP recorded. Contraception: tubal ligation Last Pap: 01/20/2015. Results were: normal Last mammogram: @39   Health Maintenance Due  Topic Date Due  . INFLUENZA VACCINE  11/20/2017   The following portions of the patient's history were reviewed and updated as appropriate: allergies, current medications, past family history, past medical history, past social history, past surgical history and problem list.  Review of Systems Pertinent items are noted in HPI.   Objective:  BP 115/76   Pulse 80   Ht 5\' 4"  (1.626 m)   Wt 243 lb (110.2 kg)   Breastfeeding? Yes   BMI 41.71 kg/m   Gen: well appearing, NAD HEENT: no scleral icterus CV: RR Lung: Normal WOB Abd: soft, non-tender Ext: warm well perfused  PELVIC: Normal appearing external genitalia; normal appearing vaginal mucosa and cervix.  No abnormal discharge noted.  Pap smear obtained.  Normal uterine size, no other palpable masses, no uterine or adnexal tenderness.  Assessment and Plan:  1. Well woman exam - counseled on routine preventative measures - planning on flu shot with partner in the future - had tubal ligation with RCS - Cytology - PAP   Please refer to After Visit Summary for other counseling recommendations.   Return in about 1 year (around 01/15/2019) for Yearly wellness exam.  Federico Flake, MD, MPH, ABFM Attending Physician Faculty Practice- [facility] for [facility]

## 2018-01-16 LAB — CYTOLOGY - PAP
DIAGNOSIS: NEGATIVE
HPV: NOT DETECTED

## 2018-02-23 ENCOUNTER — Ambulatory Visit (INDEPENDENT_AMBULATORY_CARE_PROVIDER_SITE_OTHER): Payer: 59 | Admitting: Family Medicine

## 2018-02-23 ENCOUNTER — Encounter: Payer: Self-pay | Admitting: Family Medicine

## 2018-02-23 VITALS — BP 125/77 | HR 73 | Wt 237.0 lb

## 2018-02-23 DIAGNOSIS — F419 Anxiety disorder, unspecified: Secondary | ICD-10-CM | POA: Insufficient documentation

## 2018-02-23 MED ORDER — BUSPIRONE HCL 7.5 MG PO TABS: 7.5000 mg | ORAL_TABLET | Freq: Two times a day (BID) | ORAL | 1 refills | Status: DC

## 2018-02-23 NOTE — Progress Notes (Signed)
Pt having increased anxiety and depression

## 2018-02-23 NOTE — Assessment & Plan Note (Addendum)
Since mostly is anxiety--will treat for that. She is nursing and pumping and an over-producer, so has a lot of stored milk. Begin with 1/2 tab daily x 3 days, then 1/2 tab twice daily x 3 days, then 1 tab bid. Mindfulness training. Declined to see behavioral health specialist.

## 2018-02-23 NOTE — Patient Instructions (Signed)

## 2018-02-23 NOTE — Progress Notes (Signed)
   Subjective:    Patient ID: Brenda Gutierrez is a 32 y.o. female presenting with Postpartum Care  on 02/23/2018  HPI: Feels anxious and is snapping at her kids. Everything sets her off. Gets "mommy's mean". Not sleeping well, sometimes due to being up with baby and then has trouble falling back to sleep. She is losing more weight. Some days she forgets to eat. Has started back teaching exercise class. This has not improved mood. Now feels sad. 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 She has had some therapy. Denies anhedonia or suicidality.  Review of Systems  Constitutional: Negative for chills and fever.  Respiratory: Negative for shortness of breath.   Cardiovascular: Negative for chest pain.  Gastrointestinal: Negative for abdominal pain, nausea and vomiting.  Genitourinary: Negative for dysuria.  Skin: Negative for rash.      Objective:    BP 125/77   Pulse 73   Wt 237 lb (107.5 kg)   BMI 40.68 kg/m  Physical Exam  Constitutional: She is oriented to person, place, and time. She appears well-developed and well-nourished. No distress.  HENT:  Head: Normocephalic and atraumatic.  Eyes: No scleral icterus.  Neck: Neck supple.  Cardiovascular: Normal rate.  Pulmonary/Chest: Effort normal.  Abdominal: Soft.  Neurological: She is alert and oriented to person, place, and time.  Skin: Skin is warm and dry.  Psychiatric: She has a normal mood and affect.        Assessment & Plan:   Problem List Items Addressed This Visit      Unprioritized   Anxiety - Primary    Since mostly is anxiety--will treat for that. She is nursing and pumping and an over-producer, so has a lot of stored milk. Begin with 1/2 tab daily x 3 days, then 1/2 tab twice daily x 3 days, then 1 tab bid. Mindfulness training. Declined to see behavioral health specialist.      Relevant Medications   busPIRone (BUSPAR) 7.5 MG tablet      Total face-to-face time with patient: 15  minutes. Over 50% of encounter was spent on counseling and coordination of care. Return in about 4 weeks (around 03/23/2018).  Reva Bores 02/23/2018 2:39 PM

## 2018-03-02 ENCOUNTER — Telehealth: Payer: Self-pay | Admitting: Family Medicine

## 2018-03-02 ENCOUNTER — Telehealth: Payer: Self-pay | Admitting: *Deleted

## 2018-03-02 NOTE — Telephone Encounter (Signed)
-----   Message from Clovis Riley, RN sent at 03/02/2018  1:39 PM EST ----- Regarding: FW: possible thursh  Ok called patient, she is exclusively pump feeding is having the start of thrush on her breast, her nipples are cracked and bleeding. Lactation recommended for her to try apno (all purpose nipple/nursing ointment) before taking Diflucan. Pt would like to try this ointment first.   Scheryl Marten, RN ----- Message ----- From: Reva Bores, MD Sent: 03/02/2018   1:28 PM EST To: Clovis Riley, RN Subject: RE: possible thursh                            This is not a medicine that I am familiar with or have ever heard of. I need more specifics ----- Message ----- From: Clovis Riley, RN Sent: 03/02/2018  10:23 AM EST To: Reva Bores, MD Subject: Annell Greening: possible Rosario Adie                              ----- Message ----- From: Lindell Spar, NT Sent: 03/02/2018   8:18 AM EST To: [facility] Subject: possible thursh                                Patient called requesting that a rx for Atno be called into [facility] on [address] and [address]. Please advise if you can not send rx and patient needs to be seen in office.

## 2018-03-02 NOTE — Telephone Encounter (Signed)
Called patient to get clarification of RX request we received. Pt stated she is exclusively pump feeding and has the start of thrush on her breast, and both nipples are cracked and bleeding. Pt states lactation recommended APNO ointment (all purpose nursing ointment), ptywould like to try this before taking Diflucan. Message sent to provider for request.   Scheryl Marten, RN

## 2018-03-02 NOTE — Telephone Encounter (Signed)
-----   Message from Reva Bores, MD sent at 03/02/2018  1:28 PM EST ----- Regarding: RE: possible thursh  This is not a medicine that I am familiar with or have ever heard of. I need more specifics ----- Message ----- From: Clovis Riley, RN Sent: 03/02/2018  10:23 AM EST To: Reva Bores, MD Subject: Annell Greening: possible Rosario Adie                              ----- Message ----- From: Lindell Spar, NT Sent: 03/02/2018   8:18 AM EST To: [facility] Subject: possible thursh                                Patient called requesting that a rx for Atno be called into [facility] on [address] [place]. Please advise if you can not send rx and patient needs to be seen in office.

## 2018-03-03 ENCOUNTER — Encounter: Payer: Self-pay | Admitting: *Deleted

## 2018-03-03 ENCOUNTER — Telehealth: Payer: Self-pay | Admitting: *Deleted

## 2018-03-03 NOTE — Telephone Encounter (Signed)
Called patient to let her know that we called in RX for All purpose nipple ointment to [facility] [place].   Scheryl Martenhristine Denasia Venn, RN

## 2018-03-05 ENCOUNTER — Ambulatory Visit (INDEPENDENT_AMBULATORY_CARE_PROVIDER_SITE_OTHER): Payer: 59 | Admitting: Obstetrics & Gynecology

## 2018-03-05 ENCOUNTER — Encounter: Payer: Self-pay | Admitting: Obstetrics & Gynecology

## 2018-03-05 VITALS — BP 116/76 | HR 75 | Wt 234.5 lb

## 2018-03-05 DIAGNOSIS — O9123 Nonpurulent mastitis associated with lactation: Secondary | ICD-10-CM

## 2018-03-05 MED ORDER — SULFAMETHOXAZOLE-TRIMETHOPRIM 800-160 MG PO TABS: 1.0000 | ORAL_TABLET | Freq: Two times a day (BID) | ORAL | 1 refills | Status: DC

## 2018-03-05 MED ORDER — FLUCONAZOLE 150 MG PO TABS: 150.0000 mg | ORAL_TABLET | Freq: Once | ORAL | 3 refills | Status: AC

## 2018-03-05 NOTE — Progress Notes (Signed)
GYNECOLOGY OFFICE VISIT NOTE  History:  32 y.o. [id number] here today for treatment of lactational left breast mastitis.  Noticed redness and pain 2 days ago, was using home remedies of nipple cream, warm soaks etc but it got worse. No purulent drainage noted. Able to pump on that side.. She denies any fevers or other concerns.   Past Medical History:  Diagnosis Date  . Headache    migraines  . Morbid obesity (HCC)     Past Surgical History:  Procedure Laterality Date  . CESAREAN SECTION N/A 07/21/2015   Procedure: CESAREAN SECTION;  Surgeon: Hildred LaserAnika Cherry, MD;  Location: [facility] ORS;  Service: Obstetrics;  Laterality: N/A;  . CESAREAN SECTION N/A 11/29/2017   Procedure: CESAREAN SECTION;  Surgeon: Tilda BurrowFerguson, John V, MD;  [facility];  Service: Obstetrics;  Laterality: N/A;  . OVARIAN CYST REMOVAL Left   . TONSILLECTOMY    . TUBAL LIGATION Bilateral 11/29/2017   Procedure: BILATERAL TUBAL LIGATION;  Surgeon: Tilda BurrowFerguson, John V, MD;  [facility];  Service: Obstetrics;  Laterality: Bilateral;  Filshie Clips  . TUMOR REMOVAL  2003   throat-benign  . TUMOR REMOVAL  2005   right breast-benign    The following portions of the patient's history were reviewed and updated as appropriate: allergies, current medications, past family history, past medical history, past social history, past surgical history and problem list.    Review of Systems:  Pertinent items noted in HPI and remainder of comprehensive ROS otherwise negative.  Objective:  Physical Exam BP 116/76   Pulse 75   Wt 234 lb 8 oz (106.4 kg)   BMI 40.25 kg/m  CONSTITUTIONAL: Well-developed, well-nourished female in no acute distress.  NEUROLOGIC: Alert and oriented to person, place, and time. Normal reflexes, muscle tone coordination. No cranial nerve deficit noted. PSYCHIATRIC: Normal mood and affect. Normal behavior. Normal judgment and thought content. CARDIOVASCULAR: Normal heart rate  noted RESPIRATORY: Effort and breath sounds normal, no problems with respiration noted. BREASTS: Symmetric in size. Large area ~10 cm noted on the upper lateral side of breast, tender in that region, no masses palpated. No abnormal drainage.  No other masses, skin changes, nipple drainage, or lymphadenopathy. ABDOMEN: Soft, normal bowel sounds, no distention noted.  No tenderness, rebound or guarding.  PELVIC: Normal appearing external genitalia; normal appearing vaginal mucosa and cervix.  Normal appearing discharge.  Pap smear obtained.  Normal uterine size, no other palpable masses, no uterine or adnexal tenderness. MUSCULOSKELETAL: Normal range of motion. No tenderness.  No cyanosis, clubbing, or edema.  2+ distal pulses.     Assessment & Plan:  1. Nonpurulent mastitis associated with lactation Will treat with Bactrim DS bid x 10 days, Diflucan ordered in the case of rebound yeast infection. Patient cautioned about worsening symptoms/signs of abscess. Advised to continue warm soaks, pumping and NSAIDs. - sulfamethoxazole-trimethoprim (BACTRIM DS,SEPTRA DS) 800-160 MG tablet; Take 1 tablet by mouth 2 (two) times daily.  Dispense: 20 tablet; Refill: 1 - fluconazole (DIFLUCAN) 150 MG tablet; Take 1 tablet (150 mg total) by mouth once for 1 dose. Can take additional dose three days later if symptoms persist  Dispense: 1 tablet; Refill: 3 Please refer to After Visit Summary for other counseling recommendations.   Return in about 1 week (around 03/12/2018) for Followup mastitis.   Total face-to-face time with patient: 15 minutes.  Over 50% of encounter was spent on counseling and coordination of care.   Jaynie CollinsUGONNA  Jolan Mealor, MD, FACOG Obstetrician &  Gynecologist, Product/process development scientist for [employer], [place]

## 2018-03-05 NOTE — Progress Notes (Signed)
Patient reports symptoms started a couple days ago with breast feeling clog. She has been taken motrin for pain.

## 2018-03-05 NOTE — Patient Instructions (Signed)
Breastfeeding and Mastitis  Mastitis is inflammation of the breast tissue. It can occur in women who are breastfeeding. This can make breastfeeding painful. Mastitis will sometimes go away on its own, especially if it is not caused by an infection (non-infectious mastitis). Your health care provider will help determine if medical treatment is needed. Treatment may be needed if the condition is caused by a bacterial infection (infectious mastitis).  What are the causes?  This condition is often associated with a blocked milkduct, which can happen when too much milk builds up in the breast. Causes of excess milk in the breast can include:  · Poor latch-on. If your baby is not latched onto the breast properly, he or she may not empty your breast completely while breastfeeding.  · Allowing too much time to pass between feedings.  · Wearing a bra or other clothing that is too tight. This puts extra pressure on the milk ducts so milk does not flow through them as it should.  · Milk remaining in the breast because it is overfilled (engorged).  · Stress and fatigue.    Mastitis can also be caused by a bacterial infection. Bacteria may enter the breast tissue through cuts, cracks, or openings in the skin near the nipple area. Cracks in the skin are often caused when your baby does not latch on properly to the breast.  What are the signs or symptoms?  Symptoms of this condition include:  · Swelling, redness, tenderness, and pain in an area of the breast. This usually affects the upper part of the breast, toward the armpit region. In most cases, it affects only one breast. In some cases, it may occur on both breasts at the same time and affect a larger portion of breast tissue.  · Swelling of the glands under the arm on the same side.  · Fatigue, headache, and flu-like muscle aches.  · Fever.  · Rapid pulse.    Symptoms usually last 2 to 5 days. Breast pain and redness are at their worst on day 2 and day 3, and they usually go  away by day 5. If an infection is left to progress, a collection of pus (abscess) may develop.  How is this diagnosed?  This condition can be diagnosed based on your symptoms and a physical exam. You may also have tests, such as:  · Blood tests to determine if your body is fighting a bacterial infection.  · Mammogram or ultrasound tests to rule out other problems or diseases.  · Fluid tests. If an abscess has developed, the fluid in the abscess may be removed with a needle. The fluid may be analyzed to determine if bacteria are present.  · Breast milk may be cultured and tested for bacteria.    How is this treated?  This condition will sometimes go away on its own. Your health care provider may choose to wait 24 hours after first seeing you to decide whether treatment is needed. If treatment is needed, it may include:  · Strategies to manage breastfeeding. This includes continuing to breastfeed or pump in order to allow adequate milk flow, using breast massage, and applying heat or cold to the affected area.  · Self-care such as rest and increased fluid intake.  · Medicine for pain.  · Antibiotic medicine to treat a bacterial infection. This is usually taken by mouth.  · If an abscess has developed, it may be treated by removing fluid with a needle.      Follow these instructions at home:  Medicines  · Take over-the-counter and prescription medicines only as told by your health care provider.  · If you were prescribed an antibiotic medicine, take it as told by your health care provider. Do not stop taking the antibiotic even if you start to feel better.  General instructions  · Do not wear a tight or underwire bra. Wear a soft, supportive bra.  · Increase your fluid intake, especially if you have a fever.  · Get plenty of rest.  For breastfeeding:  · Continue to empty your breasts as often as possible, either by breastfeeding or using an electric breast pump. This will lower the pressure and the pain that comes with  it. Ask your health care provider if changes need to be made to your breastfeeding or pumping routine.  · Keep your nipples clean and dry.  · During breastfeeding, empty the first breast completely before going to the other breast. If your baby is not emptying your breasts completely, use a breast pump to empty your breasts.  · Use breast massage during feeding or pumping sessions.  · If directed, apply moist heat to the affected area of your breast right before breastfeeding or pumping. Use the heat source that your health care provider recommends.  · If directed, put ice on the affected area of your breast right after breastfeeding or pumping:  ? Put ice in a plastic bag.  ? Place a towel between your skin and the bag.  ? Leave the ice on for 20 minutes.  · If you go back to work, pump your breasts while at work to stay in time with your nursing schedule.  · Do not allow your breasts to become engorged.  Contact a health care provider if:  · You have pus-like discharge from the breast.  · You have a fever.  · Your symptoms do not improve within 2 days of starting treatment.  · Your symptoms return after you have recovered from a breast infection.  Get help right away if:  · Your pain and swelling are getting worse.  · You have pain that is not controlled with medicine.  · You have a red line extending from the breast toward your armpit.  Summary  · Mastitis is inflammation of the breast tissue. It is often caused by a blocked milk duct or bacteria.  · This condition may be treated with hot and cold compresses, medicines, self-care, and certain breastfeeding strategies.  · If you were prescribed an antibiotic medicine, take it as told by your health care provider. Do not stop taking the antibiotic even if you start to feel better.  · Continue to empty your breasts as often as possible either by breastfeeding or using an electric breast pump.  This information is not intended to replace advice given to you by your  health care provider. Make sure you discuss any questions you have with your health care provider.  Document Released: 08/03/2004 Document Revised: 04/09/2016 Document Reviewed: 04/09/2016  Elsevier Interactive Patient Education © 2017 Elsevier Inc.

## 2018-03-12 ENCOUNTER — Ambulatory Visit (INDEPENDENT_AMBULATORY_CARE_PROVIDER_SITE_OTHER): Payer: 59 | Admitting: Obstetrics and Gynecology

## 2018-03-12 VITALS — BP 127/81 | HR 86 | Wt 233.0 lb

## 2018-03-12 DIAGNOSIS — Z87898 Personal history of other specified conditions: Secondary | ICD-10-CM | POA: Diagnosis not present

## 2018-03-12 NOTE — Progress Notes (Signed)
Pt states she is doing much better.

## 2018-03-13 DIAGNOSIS — Z87898 Personal history of other specified conditions: Secondary | ICD-10-CM | POA: Insufficient documentation

## 2018-03-13 HISTORY — DX: Personal history of other specified conditions: Z87.898

## 2018-03-13 NOTE — Progress Notes (Signed)
Obstetrics and Gynecology Visit Return Patient Evaluation  Appointment Date: 03/12/2018  Primary Care Provider: Mickey Farberhies, Gutierrez  [facility]  Chief Complaint: f/u left sided mastitis  History of Present Illness:  Brenda Gutierrez is a 32 y.o. seen on 11/14 for the above CC that started a few days prior; pt with no prior h/o. She is s/p rLTCS and BTL on 8/10. Pt given bactrim and set up for f/u today.    Interval History: Since that time, she states that she is feeling better and that it is pretty much resolved. She is breast feeding (pumping only)  Review of Systems: as noted in the History of Present Illness.  Medications:  Brenda Gutierrez had no medications administered during this visit. Current Outpatient Medications  Medication Sig Dispense Refill  . acetaminophen (TYLENOL) 500 MG tablet Take 1,000 mg by mouth every 6 (six) hours as needed for mild pain.    . busPIRone (BUSPAR) 7.5 MG tablet Take 1 tablet (7.5 mg total) by mouth 2 (two) times daily. 60 tablet 1  . Prenatal Vit-Fe Fumarate-FA (MULTIVITAMIN-PRENATAL) 27-0.8 MG TABS tablet Take 1 tablet by mouth daily at 12 noon.    . sulfamethoxazole-trimethoprim (BACTRIM DS,SEPTRA DS) 800-160 MG tablet Take 1 tablet by mouth 2 (two) times daily. 20 tablet 1  . ibuprofen (ADVIL,MOTRIN) 600 MG tablet Take 1 tablet (600 mg total) by mouth every 6 (six) hours. (Patient not taking: Reported on 03/05/2018) 30 tablet 0   No current facility-administered medications for this visit.     Allergies: is allergic to bee pollen; cherry flavor [flavoring agent]; insect extract allergy skin test; penicillin g; penicillins; adhesive [tape]; amoxicillin-pot clavulanate; red dye; and latex.  Physical Exam:  BP 127/81   Pulse 86   Wt 233 lb (105.7 kg)   BMI 39.99 kg/m  Body mass index is 39.99 kg/m. General appearance: Well nourished, well developed female in no acute distress.  Breast: left breast normal  in appearance and no e/o mastitis (?small area that is resolving still vs compression from clothes) but no e/o absence and nttp and no LAD Neuro/Psych:  Normal mood and affect.    Assessment: pt doing well  Plan:  1. History of mastitis Preventative measures given and when to call for additional tx  RTC: PRN  Cornelia Copaharlie Asmar Brozek, Jr MD Attending [facility](Faculty Practice)

## 2018-03-23 ENCOUNTER — Ambulatory Visit (INDEPENDENT_AMBULATORY_CARE_PROVIDER_SITE_OTHER): Payer: 59 | Admitting: Family Medicine

## 2018-03-23 ENCOUNTER — Encounter: Payer: Self-pay | Admitting: Family Medicine

## 2018-03-23 DIAGNOSIS — F419 Anxiety disorder, unspecified: Secondary | ICD-10-CM

## 2018-03-23 NOTE — Patient Instructions (Signed)

## 2018-03-23 NOTE — Progress Notes (Signed)
Depression screening 6

## 2018-03-23 NOTE — Progress Notes (Signed)
   Subjective:    Patient ID: Brenda Gutierrez is a 32 y.o. female presenting with Follow up depression  on 03/23/2018  HPI: Feels some better. Anxiety is some better but having trouble leaving the house. She is also feeling some depression. She is up to 1.5 pills/day. Some days wants to "cry over dumb things", but other days feels better.  Review of Systems  Constitutional: Negative for chills and fever.  Respiratory: Negative for shortness of breath.   Cardiovascular: Negative for chest pain.  Gastrointestinal: Negative for abdominal pain, nausea and vomiting.  Genitourinary: Negative for dysuria.  Skin: Negative for rash.      Objective:    BP 124/71   Pulse 71   Wt 235 lb (106.6 kg)   BMI 40.34 kg/m  Physical Exam  Constitutional: She is oriented to person, place, and time. She appears well-developed and well-nourished. No distress.  HENT:  Head: Normocephalic and atraumatic.  Eyes: No scleral icterus.  Neck: Neck supple.  Cardiovascular: Normal rate.  Pulmonary/Chest: Effort normal.  Abdominal: Soft.  Neurological: She is alert and oriented to person, place, and time.  Skin: Skin is warm and dry.  Psychiatric: She has a normal mood and affect.        Assessment & Plan:   Problem List Items Addressed This Visit      Unprioritized   Anxiety    After discussion, risks and benefits, she may increase her Buspar to 1 bid or stay on current meds. She does not desire further increase in doses or addition of other meds. She contracts for safety.          Total face-to-face time with patient: 15 minutes. Over 50% of encounter was spent on counseling and coordination of care. Return in about 3 months (around 06/22/2018).  Reva Boresanya S Aram Domzalski 03/23/2018 3:07 PM

## 2018-03-24 ENCOUNTER — Encounter: Payer: Self-pay | Admitting: Family Medicine

## 2018-03-24 NOTE — Assessment & Plan Note (Signed)
After discussion, risks and benefits, she may increase her Buspar to 1 bid or stay on current meds. She does not desire further increase in doses or addition of other meds. She contracts for safety.

## 2018-06-01 ENCOUNTER — Telehealth: Payer: Self-pay

## 2018-06-01 ENCOUNTER — Other Ambulatory Visit: Payer: Self-pay

## 2018-06-01 MED ORDER — SULFAMETHOXAZOLE-TRIMETHOPRIM 800-160 MG PO TABS: 1.0000 | ORAL_TABLET | Freq: Two times a day (BID) | ORAL | 0 refills | Status: DC

## 2018-06-01 NOTE — Telephone Encounter (Signed)
Received call from patient regarding mastitis again. She is pretty sure she has it again. She was told by previous provider that she could called back to get another refill of bactrim ds. She is allergic to penicillin. I have called in her a new rx of Bactrim DS and advised her to follow up with the office for appointment in one week. Patient voice understanding at this time.

## 2018-11-06 ENCOUNTER — Encounter: Payer: Self-pay | Admitting: Radiology

## 2018-12-10 ENCOUNTER — Other Ambulatory Visit: Payer: Self-pay

## 2018-12-10 DIAGNOSIS — Z20822 Contact with and (suspected) exposure to covid-19: Secondary | ICD-10-CM

## 2018-12-11 LAB — NOVEL CORONAVIRUS, NAA: SARS-CoV-2, NAA: NOT DETECTED

## 2018-12-29 ENCOUNTER — Other Ambulatory Visit: Payer: Self-pay | Admitting: Family Medicine

## 2018-12-29 DIAGNOSIS — F419 Anxiety disorder, unspecified: Secondary | ICD-10-CM

## 2018-12-29 MED ORDER — BUSPIRONE HCL 7.5 MG PO TABS: 7.5000 mg | ORAL_TABLET | Freq: Two times a day (BID) | ORAL | 1 refills | Status: DC

## 2019-05-22 IMAGING — US US MFM OB DETAIL+14 WK
1 series · 13 of 28 positions shown · non-contrast
Comparison: none

[Series 1: us mfm ob detail+14 wk · 72 acquisitions, 13 frames shown]
[im 3/72]
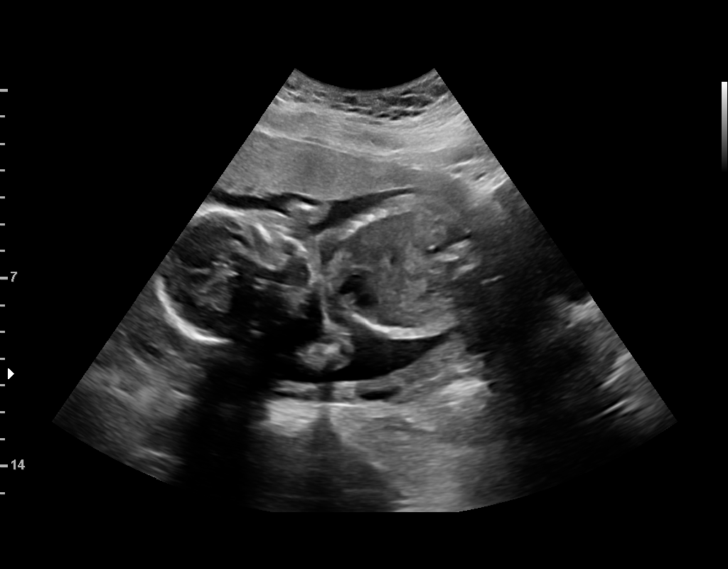
[im 8/72]
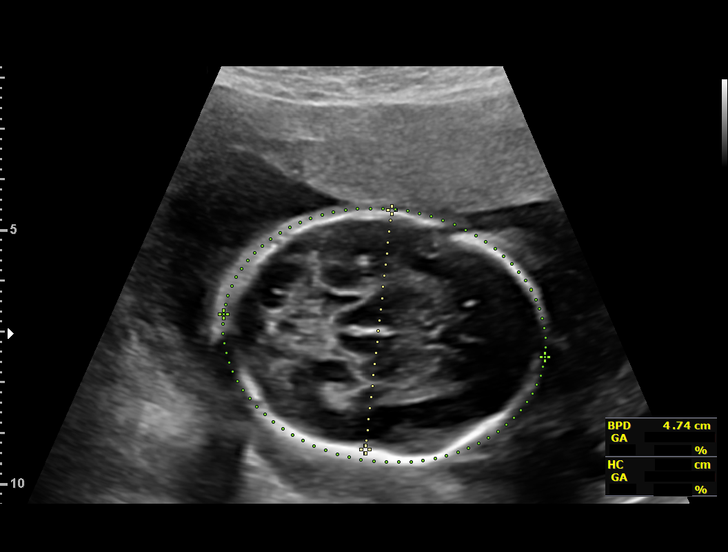
[im 14/72]
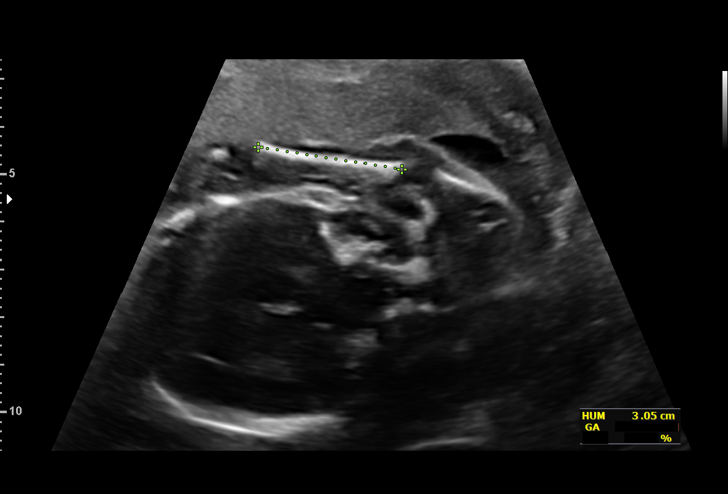
[im 19/72]
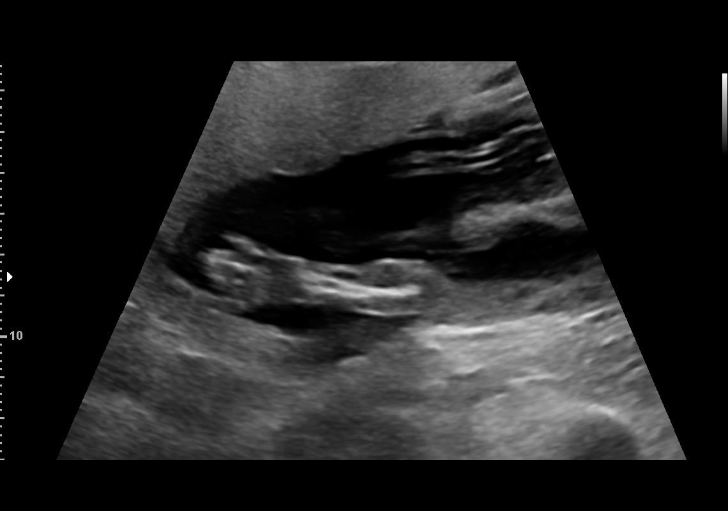
[im 24/72]
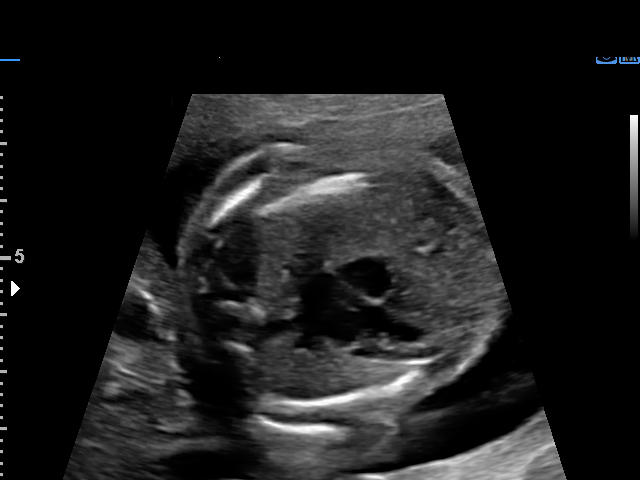
[im 29/72]
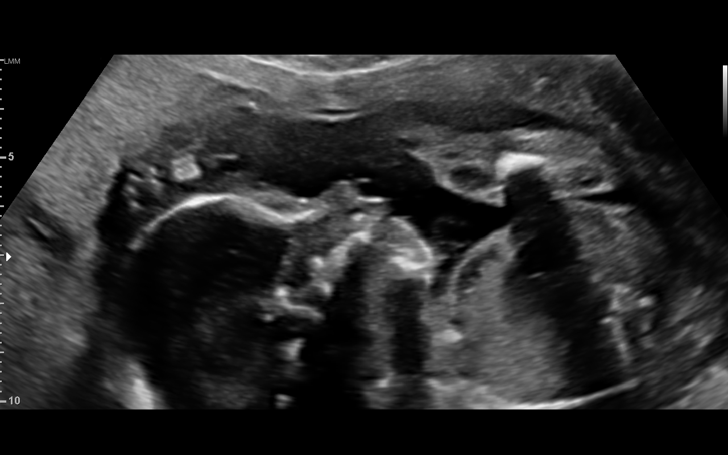
[im 37/72]
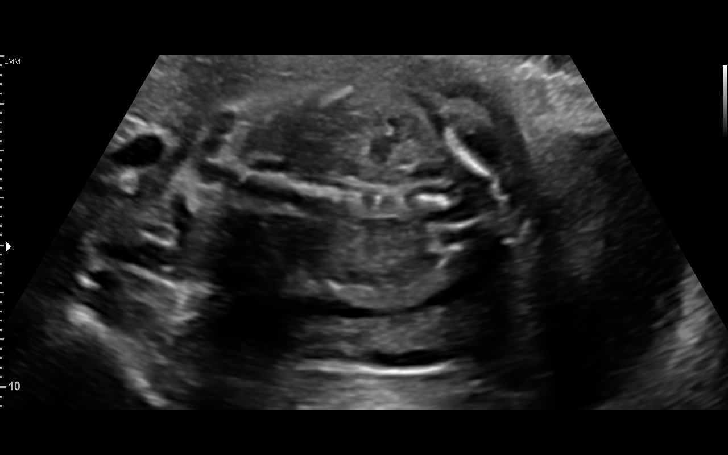
[im 43/72]
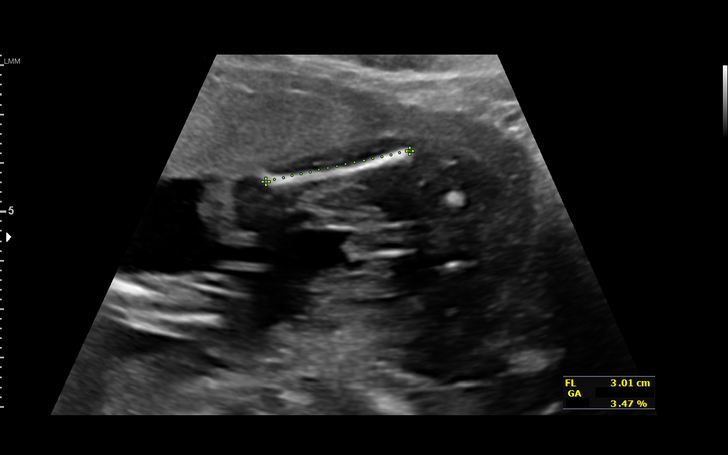
[im 48/72]
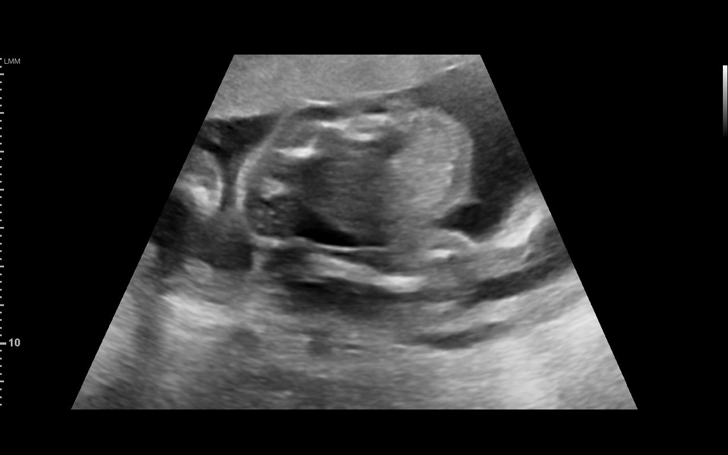
[im 53/72]
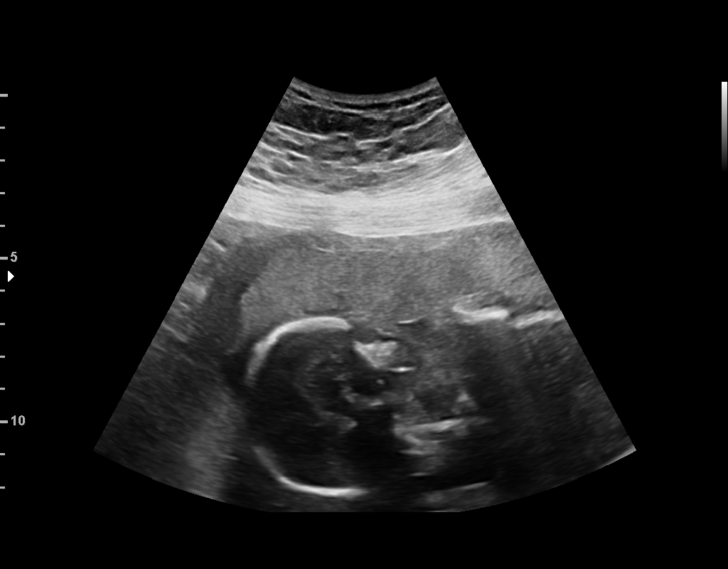
[im 58/72]
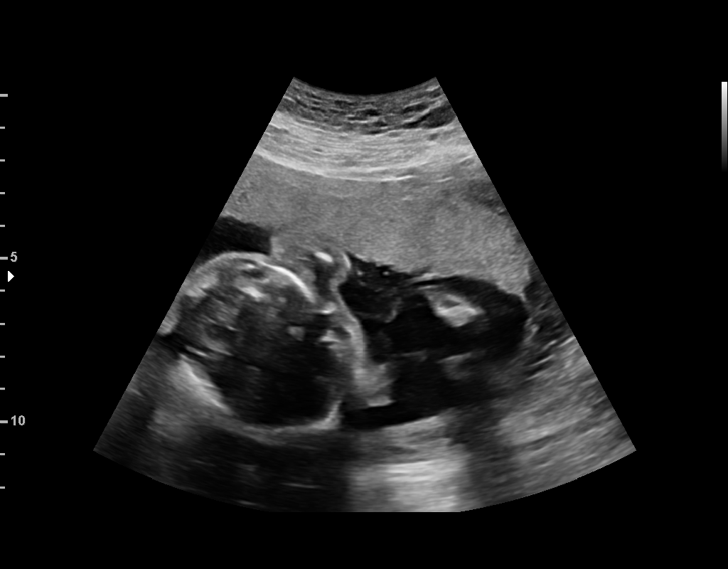
[im 64/72]
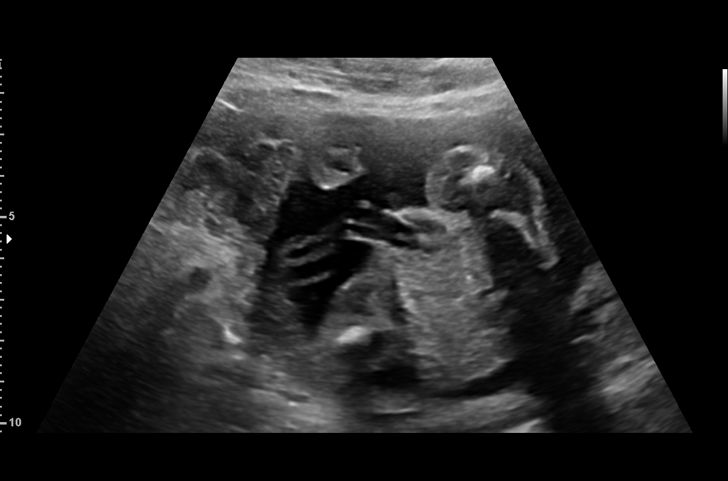
[im 69/72]
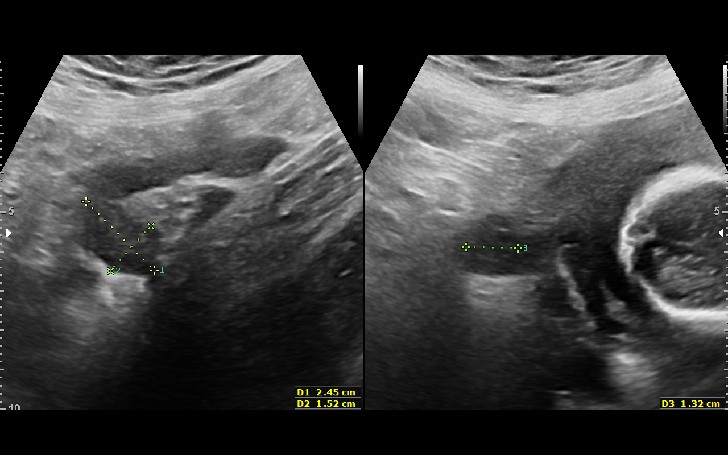

[13 of 28 positions shown; findings below may reference images not displayed]

[REDACTED]

1  SAVIO LOCKLEAR          [id number]      [id number]     [id number]
Indications

21 weeks gestation of pregnancy
Encounter for fetal anatomic survey
Obesity complicating pregnancy, second
trimester
History of cesarean delivery, currently
pregnant
OB History

Gravidity:    2         Term:   1
Living:       1
Fetal Evaluation

Num Of Fetuses:     1
Fetal Heart         148
Rate(bpm):
Cardiac Activity:   Observed
Presentation:       Variable
Placenta:           Anterior, above cervical os
P. Cord Insertion:  Visualized, central

Amniotic Fluid
AFI FV:      Subjectively within normal limits

Largest Pocket(cm)
3.24
Biometry

BPD:      47.5  mm     G. Age:  20w 2d         24  %    CI:        70.14   %    70 - 86
FL/HC:      16.5   %    15.9 -
HC:      180.9  mm     G. Age:  20w 3d         21  %    HC/AC:      1.10        1.06 -
AC:      163.9  mm     G. Age:  21w 3d         58  %    FL/BPD:     62.7   %
FL:       29.8  mm     G. Age:  19w 2d        < 3  %    FL/AC:      18.2   %    20 - 24
HUM:      30.4  mm     G. Age:  20w 0d         21  %
CER:      21.4  mm     G. Age:  20w 3d         36  %
CM:        4.3  mm

Est. FW:     353  gm    0 lb 12 oz      32  %
Gestational Age

LMP:           21w 0d        Date:  02/28/17                 EDD:   12/05/17
U/S Today:     20w 3d                                        EDD:   12/09/17
Best:          21w 0d     Det. By:  LMP  (02/28/17)          EDD:   12/05/17
Anatomy

Cranium:               Appears normal         Aortic Arch:            Appears normal
Cavum:                 Appears normal         Ductal Arch:            Appears normal
Ventricles:            Appears normal         Diaphragm:              Appears normal
Choroid Plexus:        Appears normal         Stomach:                Appears normal, left
sided
Cerebellum:            Appears normal         Abdomen:                Appears normal
Posterior Fossa:       Appears normal         Abdominal Wall:         Appears nml (cord
insert, abd wall)
Nuchal Fold:           Not applicable (>20    Cord Vessels:           Appears normal (3
wks GA)                                        vessel cord)
Face:                  Appears normal         Kidneys:                Appear normal
(orbits and profile)
Lips:                  Appears normal         Bladder:                Appears normal
Thoracic:              Appears normal         Spine:                  Not well visualized
Heart:                 Appears normal         Upper Extremities:      Appears normal
(4CH, axis, and situs
RVOT:                  Appears normal         Lower Extremities:      Appears normal
LVOT:                  Appears normal

Other:  Male gender. Heels visualized.
Cervix Uterus Adnexa

Cervix
Length:           5.64  cm.
Normal appearance by transabdominal scan.

Uterus
No abnormality visualized.

Left Ovary
Within normal limits.

Right Ovary
Within normal limits.

Cul De Sac:   No free fluid seen.

Adnexa:       No abnormality visualized.
Impression

Singleton intrauterine pregnancy at 21+0  weeks with obesity
here for anatomic survey
Review of the anatomy shows no sonographic markers for
aneuploidy or structural anomalies
However, views of the fetal spine should be considered
suboptimal secondary to
Amniotic fluid volume is normal
Estimated fetal weight shows growth in the 32nd percentile
Recommendations

Recommend follow-up ultrasound examination in 4 weeks for
completion of the anatomic survey

## 2019-07-20 IMAGING — US US MFM OB FOLLOW-UP
1 series · 14 of 28 positions shown · non-contrast
Comparison: none

[Series 1: us mfm ob follow-up · 55 acquisitions, 14 frames shown]
[im 3/55]
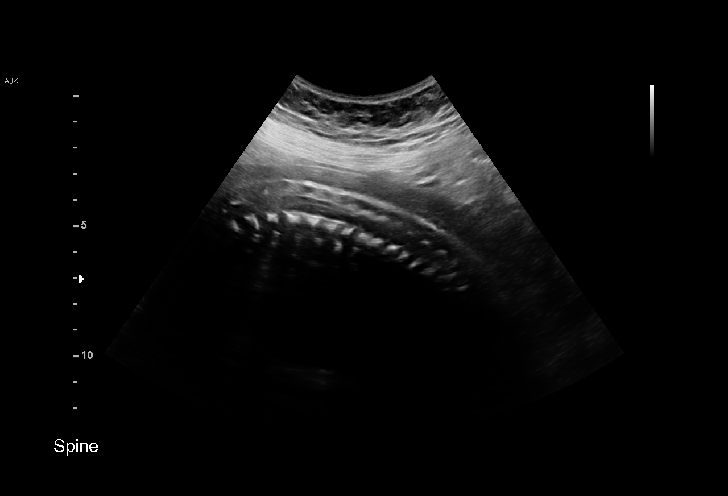
[im 7/55]
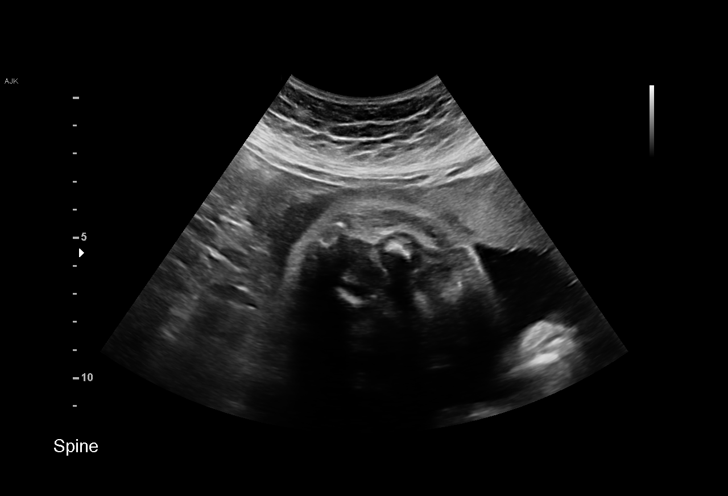
[im 11/55]
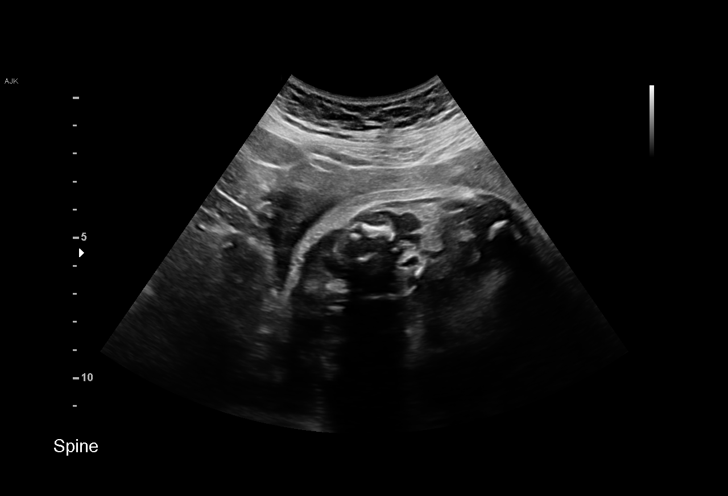
[im 15/55]
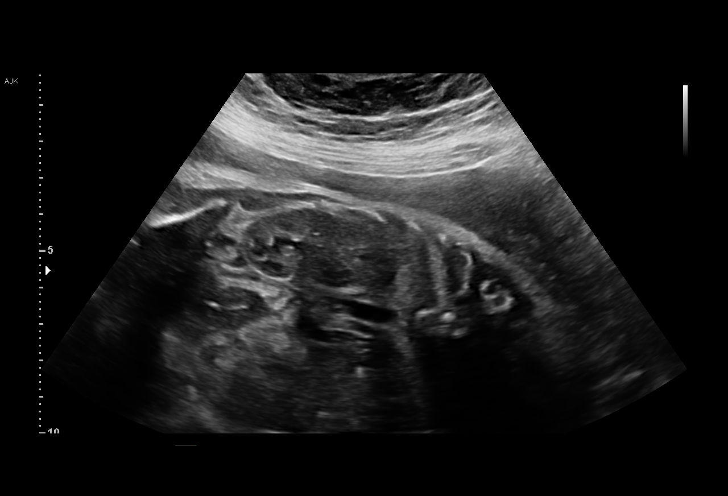
[im 19/55]
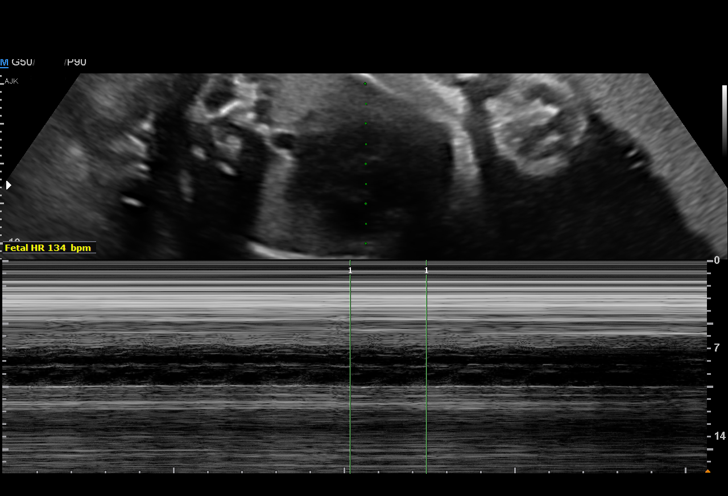
[im 23/55]
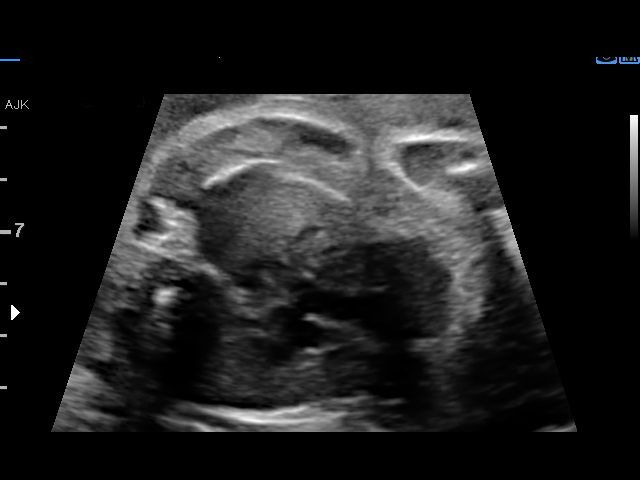
[im 27/55]
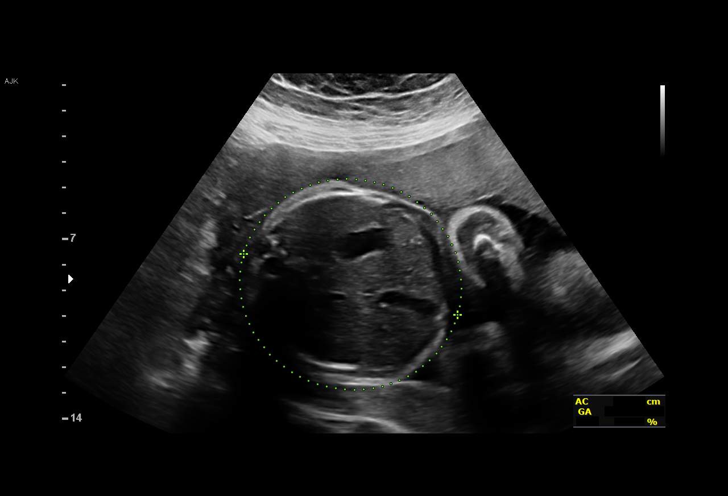
[im 31/55]
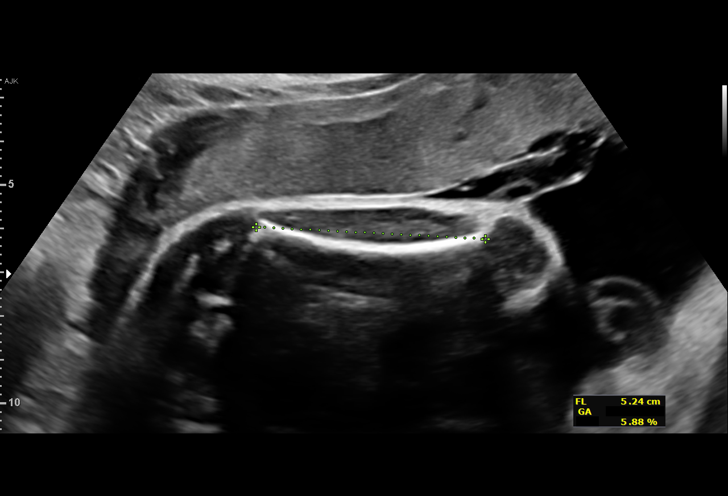
[im 35/55]
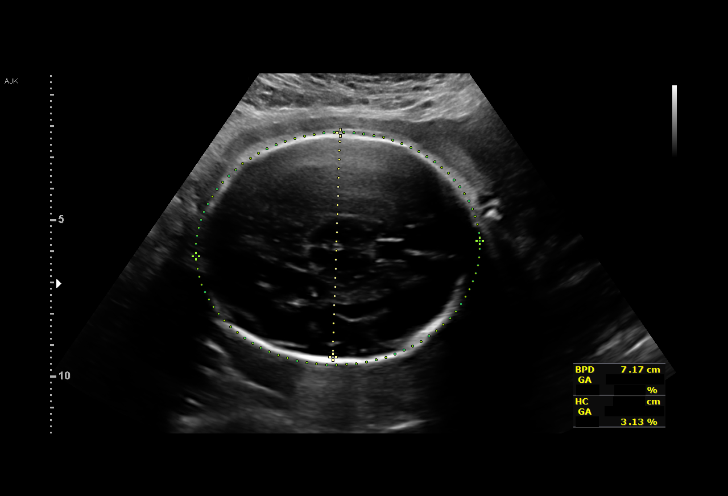
[im 39/55]
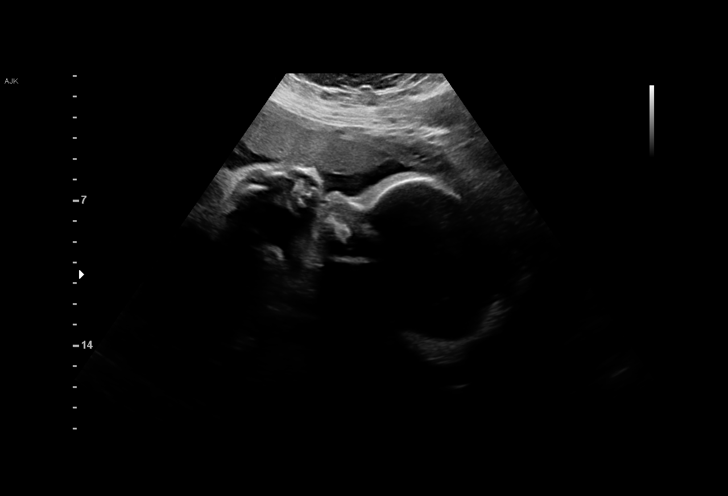
[im 43/55]
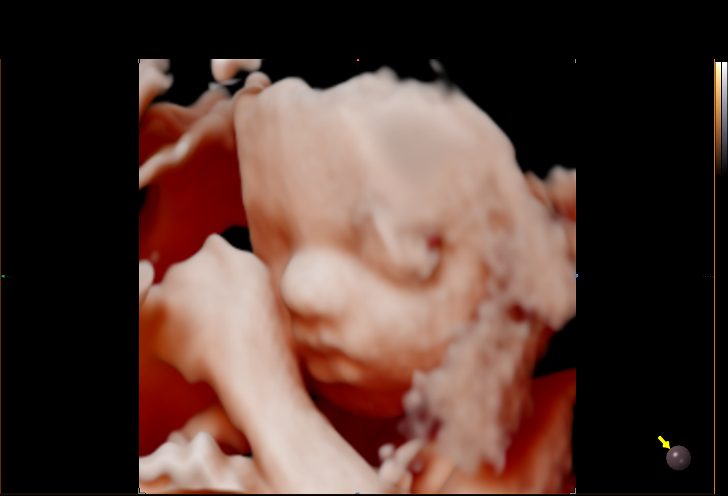
[im 47/55]
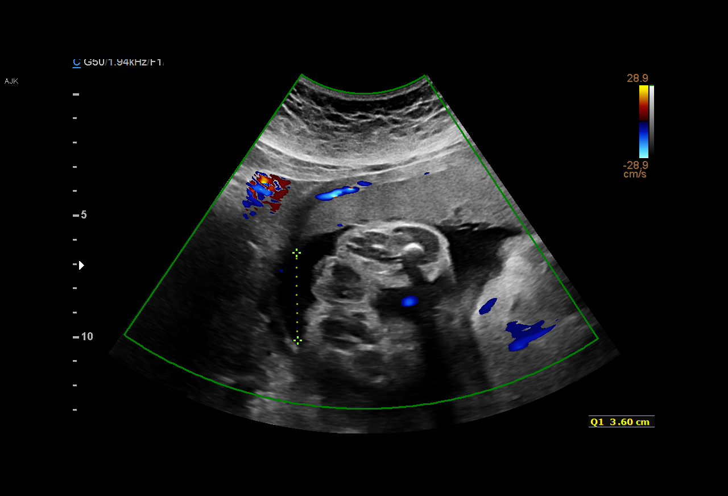
[im 51/55]
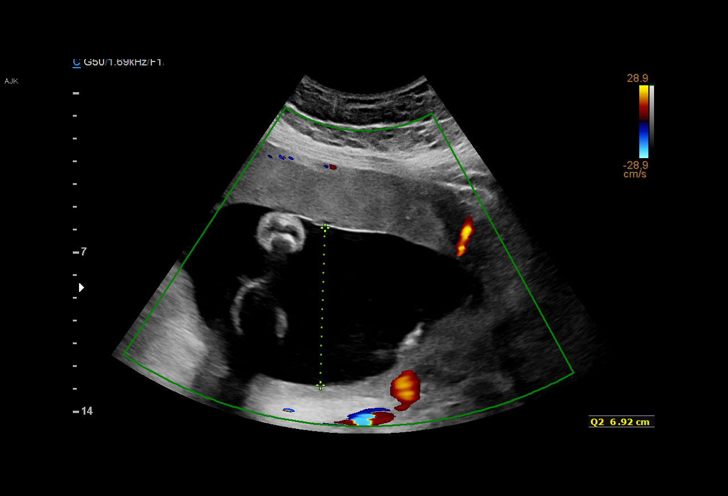
[im 55/55]
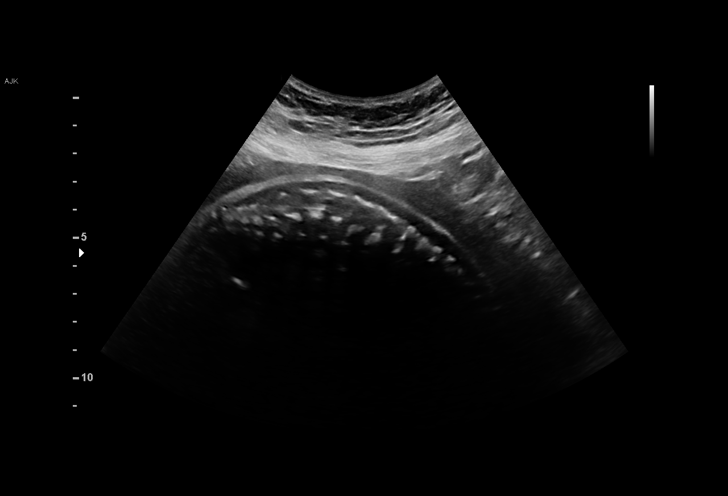

[14 of 28 positions shown; findings below may reference images not displayed]

[REDACTED]

1  SUHAS SORTO              [id number]      [id number]     [id number]
Indications

29 weeks gestation of pregnancy
History of cesarean delivery, currently
pregnant
Maternal morbid obesity
Encounter for other antenatal screening
follow-up
OB History

Blood Type:            Height:  5'4"   Weight (lb):  257       BMI:
Gravidity:    2         Term:   1
Living:       1
Fetal Evaluation

Num Of Fetuses:     1
Fetal Heart         134
Rate(bpm):
Cardiac Activity:   Observed
Presentation:       Cephalic
Placenta:           Anterior, above cervical os

Amniotic Fluid
AFI FV:      Subjectively within normal limits

AFI Sum(cm)     %Tile       Largest Pocket(cm)
15.52           55
RUQ(cm)                     LUQ(cm)        LLQ(cm)
6.09
Biometry

BPD:      71.6  mm     G. Age:  28w 5d         18  %    CI:         70.97  %    70 - 86
FL/HC:       19.5  %    19.6 -
HC:      270.8  mm     G. Age:  29w 4d         21  %    HC/AC:       1.02       0.99 -
AC:      264.6  mm     G. Age:  30w 4d         77  %    FL/BPD:      73.6  %    71 - 87
FL:       52.7  mm     G. Age:  28w 0d          8  %    FL/AC:       19.9  %    20 - 24

Est. FW:    0008   gm     3 lb 2 oz     54  %
Gestational Age

LMP:           29w 3d        Date:  02/28/17                 EDD:    12/05/17
U/S Today:     29w 2d                                        EDD:    12/06/17
Best:          29w 3d     Det. By:  LMP  (02/28/17)          EDD:    12/05/17
Anatomy

Cranium:               Appears normal         Aortic Arch:            Previously seen
Cavum:                 Appears normal         Ductal Arch:            Previously seen
Ventricles:            Appears normal         Diaphragm:              Previously seen
Choroid Plexus:        Previously seen        Stomach:                Appears normal, left
sided
Cerebellum:            Previously seen        Abdomen:                Appears normal
Posterior Fossa:       Previously seen        Abdominal Wall:         Previously seen
Nuchal Fold:           Not applicable (>20    Cord Vessels:           Previously seen
wks GA)
Face:                  Orbits and profile     Kidneys:                Appear normal
previously seen
Lips:                  Appears normal         Bladder:                Appears normal
Thoracic:              Appears normal         Spine:                  Appears normal
Heart:                 Appears normal         Upper Extremities:      Previously seen
(4CH, axis, and situs
RVOT:                  Appears normal         Lower Extremities:      Previously seen
LVOT:                  Appears normal

Other:  Male gender. Heels previously visualized. Technically difficult due to
maternal habitus and fetal position.
Impression

SIUP at 29+3 weeks
Normal interval anatomy; anatomic survey complete
Normal amniotic fluid volume
Appropriate interval growth with EFW at the 54th %tile
Recommendations

Follow-up as clinically indicated

## 2020-11-07 ENCOUNTER — Other Ambulatory Visit (HOSPITAL_COMMUNITY)
Admission: RE | Admit: 2020-11-07 | Discharge: 2020-11-07 | Disposition: A | Discharge status: Home / Self Care | Payer: BC Managed Care – PPO | Source: Ambulatory Visit | Attending: Obstetrics and Gynecology | Admitting: Obstetrics and Gynecology

## 2020-11-07 ENCOUNTER — Ambulatory Visit (INDEPENDENT_AMBULATORY_CARE_PROVIDER_SITE_OTHER): Payer: BC Managed Care – PPO | Admitting: Obstetrics and Gynecology

## 2020-11-07 ENCOUNTER — Encounter: Payer: Self-pay | Admitting: Obstetrics and Gynecology

## 2020-11-07 ENCOUNTER — Other Ambulatory Visit: Payer: Self-pay

## 2020-11-07 VITALS — BP 102/69 | HR 69 | Ht 64.0 in | Wt 207.2 lb

## 2020-11-07 DIAGNOSIS — Z01419 Encounter for gynecological examination (general) (routine) without abnormal findings: Secondary | ICD-10-CM

## 2020-11-07 NOTE — Progress Notes (Signed)
Subjective:     Brenda Gutierrez is a 35 y.o. female P2 with BMI 35 who is here for a comprehensive physical exam. The patient reports no problems. She reports a monthly period lasting 5 days. She is sexually active without complaints using BTL. She denies pelvic pain or abnormal discharge. Patient reports the presence of a labia abscess approximately 2 weeks ago which she self drained using a diabetic lancet. She reports drainage of pus. Patient reports some persistent soreness at the site. She is without any other complaints.  Past Medical History:  Diagnosis Date   Headache    migraines   Morbid obesity [facility])    Past Surgical History:  Procedure Laterality Date   CESAREAN SECTION N/A 07/21/2015   Procedure: CESAREAN SECTION;  Surgeon: Hildred Laser, MD;  Location: [facility] ORS;  Service: Obstetrics;  Laterality: N/A;   CESAREAN SECTION N/A 11/29/2017   Procedure: CESAREAN SECTION;  Surgeon: Tilda Burrow, MD;  Location: [facility] BIRTHING SUITES;  Service: Obstetrics;  Laterality: N/A;   OVARIAN CYST REMOVAL Left    TONSILLECTOMY     TUBAL LIGATION Bilateral 11/29/2017   Procedure: BILATERAL TUBAL LIGATION;  Surgeon: Tilda Burrow, MD;  Location: [facility] BIRTHING SUITES;  Service: Obstetrics;  Laterality: Bilateral;  Filshie Clips   TUMOR REMOVAL  2003   throat-benign   TUMOR REMOVAL  2005   right breast-benign   Family History  Problem Relation Age of Onset   Diabetes Mother    Depression Mother    Anxiety disorder Mother    Depression Father    Hypertension Father    Diabetes Father    Bipolar disorder Father    Hyperlipidemia Father    Hypertension Paternal Grandmother    Hyperlipidemia Paternal Grandmother    Hypertension Paternal Grandfather    Hyperlipidemia Paternal Grandfather    Breast cancer Paternal Aunt      Social History   Socioeconomic History   Marital status: Married    Spouse name: Chrissie Noa   Number of children: 1   Years of education: 16   Highest education  level: Bachelor's degree (e.g., BA, AB, BS)  Occupational History   Occupation: Stay at home mother  Tobacco Use   Smoking status: Former    Types: Cigarettes    Quit date: 07/26/2007    Years since quitting: 13.2   Smokeless tobacco: Never  Vaping Use   Vaping Use: Never used  Substance and Sexual Activity   Alcohol use: No    Alcohol/week: 0.0 standard drinks    Comment: social   Drug use: No   Sexual activity: Not Currently    Partners: Male    Birth control/protection: None  Other Topics Concern   Not on file  Social History Narrative   Not on file   Social Determinants of Health   Financial Resource Strain: Not on file  Food Insecurity: Not on file  Transportation Needs: Not on file  Physical Activity: Not on file  Stress: Not on file  Social Connections: Not on file  Intimate Partner Violence: Not on file   Health Maintenance  Topic Date Due   COVID-19 Vaccine (1) Never done   Hepatitis C Screening  Never done   INFLUENZA VACCINE  11/20/2020   PAP SMEAR-Modifier  01/14/2021   TETANUS/TDAP  09/13/2027   HIV Screening  Completed   Pneumococcal Vaccine 37-37 Years old  Aged Out   HPV VACCINES  Aged Out       Review of Systems  Pertinent items noted in HPI and remainder of comprehensive ROS otherwise negative.   Objective:  Blood pressure 102/69, pulse 69, height 5\' 4"  (1.626 m), weight 207 lb 3.2 oz (94 kg), currently breastfeeding.    GENERAL: Well-developed, well-nourished female in no acute distress.  HEENT: Normocephalic, atraumatic. Sclerae anicteric.  NECK: Supple. Normal thyroid.  LUNGS: Clear to auscultation bilaterally.  HEART: Regular rate and rhythm. BREASTS: Symmetric in size. No palpable masses or lymphadenopathy, skin changes, or nipple drainage. ABDOMEN: Soft, nontender, nondistended. No organomegaly. PELVIC: Normal external female genitalia. Vagina is pink and rugated.  Normal discharge. Normal appearing cervix. Uterus is normal in size.   No adnexal mass or tenderness. No evidence of Bartholin abscess or vulva abscess EXTREMITIES: No cyanosis, clubbing, or edema, 2+ distal pulses.    Assessment:    Healthy female exam.      Plan:    Pap smear collected Perineal and vulva care provided Health maintenance labs at a later date when fasting Patient will be contacted with abnormal results Patient declined STI screening See After Visit Summary for Counseling Recommendations

## 2020-11-09 LAB — CYTOLOGY - PAP
Adequacy: ABSENT
Diagnosis: NEGATIVE

## 2020-11-13 ENCOUNTER — Ambulatory Visit: Payer: 59 | Admitting: Family Medicine

## 2020-12-19 DIAGNOSIS — D2272 Melanocytic nevi of left lower limb, including hip: Secondary | ICD-10-CM | POA: Diagnosis not present

## 2020-12-19 DIAGNOSIS — D485 Neoplasm of uncertain behavior of skin: Secondary | ICD-10-CM | POA: Diagnosis not present

## 2020-12-19 DIAGNOSIS — D2261 Melanocytic nevi of right upper limb, including shoulder: Secondary | ICD-10-CM | POA: Diagnosis not present

## 2020-12-19 DIAGNOSIS — D225 Melanocytic nevi of trunk: Secondary | ICD-10-CM | POA: Diagnosis not present

## 2020-12-19 DIAGNOSIS — D2262 Melanocytic nevi of left upper limb, including shoulder: Secondary | ICD-10-CM | POA: Diagnosis not present

## 2021-05-15 DIAGNOSIS — L2389 Allergic contact dermatitis due to other agents: Secondary | ICD-10-CM | POA: Diagnosis not present

## 2021-05-15 DIAGNOSIS — R21 Rash and other nonspecific skin eruption: Secondary | ICD-10-CM | POA: Diagnosis not present

## 2021-05-22 DIAGNOSIS — J019 Acute sinusitis, unspecified: Secondary | ICD-10-CM | POA: Diagnosis not present

## 2021-07-12 ENCOUNTER — Other Ambulatory Visit: Payer: Self-pay

## 2021-07-12 ENCOUNTER — Encounter: Payer: Self-pay | Admitting: Family

## 2021-07-12 ENCOUNTER — Ambulatory Visit: Payer: BC Managed Care – PPO | Admitting: Family

## 2021-07-12 VITALS — BP 110/68 | HR 80 | Ht 64.0 in | Wt 229.0 lb

## 2021-07-12 DIAGNOSIS — L659 Nonscarring hair loss, unspecified: Secondary | ICD-10-CM | POA: Diagnosis not present

## 2021-07-12 DIAGNOSIS — Z6839 Body mass index (BMI) 39.0-39.9, adult: Secondary | ICD-10-CM

## 2021-07-12 DIAGNOSIS — N920 Excessive and frequent menstruation with regular cycle: Secondary | ICD-10-CM | POA: Diagnosis not present

## 2021-07-12 DIAGNOSIS — D649 Anemia, unspecified: Secondary | ICD-10-CM | POA: Insufficient documentation

## 2021-07-12 DIAGNOSIS — E559 Vitamin D deficiency, unspecified: Secondary | ICD-10-CM

## 2021-07-12 DIAGNOSIS — R635 Abnormal weight gain: Secondary | ICD-10-CM

## 2021-07-12 DIAGNOSIS — R5383 Other fatigue: Secondary | ICD-10-CM

## 2021-07-12 DIAGNOSIS — L700 Acne vulgaris: Secondary | ICD-10-CM | POA: Diagnosis not present

## 2021-07-12 DIAGNOSIS — Z13220 Encounter for screening for lipoid disorders: Secondary | ICD-10-CM | POA: Diagnosis not present

## 2021-07-12 DIAGNOSIS — Z833 Family history of diabetes mellitus: Secondary | ICD-10-CM | POA: Diagnosis not present

## 2021-07-12 DIAGNOSIS — L680 Hirsutism: Secondary | ICD-10-CM | POA: Diagnosis not present

## 2021-07-12 DIAGNOSIS — D518 Other vitamin B12 deficiency anemias: Secondary | ICD-10-CM

## 2021-07-12 DIAGNOSIS — E049 Nontoxic goiter, unspecified: Secondary | ICD-10-CM

## 2021-07-12 LAB — CBC WITH DIFFERENTIAL/PLATELET
Basophils Absolute: 0.1 10*3/uL (ref 0.0–0.1)
Basophils Relative: 0.9 % (ref 0.0–3.0)
Eosinophils Absolute: 0.1 10*3/uL (ref 0.0–0.7)
Eosinophils Relative: 1.7 % (ref 0.0–5.0)
HCT: 37.3 % (ref 36.0–46.0)
Hemoglobin: 12.3 g/dL (ref 12.0–15.0)
Lymphocytes Relative: 26.9 % (ref 12.0–46.0)
Lymphs Abs: 2.1 10*3/uL (ref 0.7–4.0)
MCHC: 33 g/dL (ref 30.0–36.0)
MCV: 83.5 fl (ref 78.0–100.0)
Monocytes Absolute: 0.5 10*3/uL (ref 0.1–1.0)
Monocytes Relative: 6 % (ref 3.0–12.0)
Neutro Abs: 5.1 10*3/uL (ref 1.4–7.7)
Neutrophils Relative %: 64.5 % (ref 43.0–77.0)
Platelets: 261 10*3/uL (ref 150.0–400.0)
RBC: 4.47 Mil/uL (ref 3.87–5.11)
RDW: 14.5 % (ref 11.5–15.5)
WBC: 7.9 10*3/uL (ref 4.0–10.5)

## 2021-07-12 LAB — COMPREHENSIVE METABOLIC PANEL
ALT: 19 U/L (ref 0–35)
AST: 18 U/L (ref 0–37)
Albumin: 4.2 g/dL (ref 3.5–5.2)
Alkaline Phosphatase: 64 U/L (ref 39–117)
BUN: 16 mg/dL (ref 6–23)
CO2: 28 mEq/L (ref 19–32)
Calcium: 9 mg/dL (ref 8.4–10.5)
Chloride: 106 mEq/L (ref 96–112)
Creatinine, Ser: 0.67 mg/dL (ref 0.40–1.20)
GFR: 112.45 mL/min (ref >60.00)
Glucose, Bld: 99 mg/dL (ref 70–99)
Potassium: 4.3 mEq/L (ref 3.5–5.1)
Sodium: 140 mEq/L (ref 135–145)
Total Bilirubin: 0.7 mg/dL (ref 0.2–1.2)
Total Protein: 6.6 g/dL (ref 6.0–8.3)

## 2021-07-12 LAB — LIPID PANEL
Cholesterol: 213 mg/dL — ABNORMAL HIGH (ref 0–200)
HDL: 66.7 mg/dL (ref >39.00)
LDL Cholesterol: 130 mg/dL — ABNORMAL HIGH (ref 0–99)
NonHDL: 145.84
Total CHOL/HDL Ratio: 3
Triglycerides: 80 mg/dL (ref 0.0–149.0)
VLDL: 16 mg/dL (ref 0.0–40.0)

## 2021-07-12 LAB — B12 AND FOLATE PANEL
Folate: 12.7 ng/mL (ref >5.9)
Vitamin B-12: 508 pg/mL (ref 211–911)

## 2021-07-12 LAB — T4, FREE: Free T4: 0.86 ng/dL (ref 0.60–1.60)

## 2021-07-12 LAB — FOLLICLE STIMULATING HORMONE: FSH: 3.1 m[IU]/mL

## 2021-07-12 LAB — VITAMIN D 25 HYDROXY (VIT D DEFICIENCY, FRACTURES): VITD: 19.13 ng/mL — ABNORMAL LOW (ref 30.00–100.00)

## 2021-07-12 LAB — T3, FREE: T3, Free: 3.7 pg/mL (ref 2.3–4.2)

## 2021-07-12 LAB — TSH: TSH: 1.54 u[IU]/mL (ref 0.35–5.50)

## 2021-07-12 NOTE — Patient Instructions (Addendum)
Stop by the lab prior to leaving today. I will notify you of your results once received.  ? ?I have ordered imaging for you at [facility] outpatient diagnostic center with your thyroid ultrasound. This order has been sent over for you electronically.  ?Please call [phone] to schedule this appointment if you do not hear from them in the next two weeks. ? ?It was a pleasure seeing you today! Please do not hesitate to reach out with any questions and or concerns. ? ?Regards,  ? ?Ison Wichmann ?FNP-C ? ?

## 2021-07-12 NOTE — Progress Notes (Addendum)
? ?New Patient Office Visit ? ?Subjective:  ?Patient ID: Brenda Gutierrez, female    DOB: 1985/07/30  Age: 36 y.o. MRN: [id number] ? ?CC:  ?Chief Complaint  ?Patient presents with  ?? New Patient (Initial Visit)  ?  Establish and weight gain.pt is fitness instruction.  ? ? ?HPI ?Brenda Gutierrez is here to establish care as a new patient. ? ?Prior provider was: Mickey Farber in [place], many years ago at [facility] ?Pt is without acute concerns.  ? 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 Two years ago she did do optivia but was sick while on the program so d/c. She did lose 40 pounds for that program and steadily gained about ten pounds in the last year after that. In the last one year with increase in weight of 25 pounds. She states her diet has been really good, she focuses on proper plating and moderation.  ? ?Tinnie Gens is GYN. 11/07/20. Has mammogram schedule with her GYN.through [facility].  ? ?chronic concerns: ? ?Migraines, intractable, last episode January but able to identify trigger because with sinusitis at the times.  ? ?Gad: manageable, was on buspirone in the past but doing well off of this. No SI HI.  ?Does see a therapist once a month. Oes fine she is more emotional more days than not, curious if hormonal as she doesn't feel this is depression related.  ? ?Menses are shorter than used to be, now every 26 days vs every 33. Usually 7-8 days, super heavy and painful but improving some.  ?Notices some hair on her face, not overly so.  ?Does see acne on forehead.  ? ?Past Medical History:  ?Diagnosis Date  ?? Common migraine with intractable migraine 01/04/2014  ?? Headache   ? migraines  ?? History of mastitis 03/13/2018  ?? Morbid obesity (HCC)   ? ? ?Past Surgical History:  ?Procedure Laterality Date  ??  CESAREAN SECTION N/A 07/21/2015  ? Procedure: CESAREAN SECTION;  Surgeon: Hildred Laser, MD;  Location: [facility] ORS;  Service: Obstetrics;  Laterality: N/A;  ?? CESAREAN SECTION N/A 11/29/2017  ? Procedure: CESAREAN SECTION;  Surgeon: Tilda Burrow, MD;  Location: [facility] BIRTHING SUITES;  Service: Obstetrics;  Laterality: N/A;  ?? OVARIAN CYST REMOVAL Left   ?? TONSILLECTOMY    ?? TUBAL LIGATION Bilateral 11/29/2017  ? Procedure: BILATERAL TUBAL LIGATION;  Surgeon: Tilda Burrow, MD;  Location: [facility] BIRTHING SUITES;  Service: Obstetrics;  Laterality: Bilateral;  Filshie Clips  ?? TUMOR REMOVAL  2003  ? throat-benign  ?? TUMOR REMOVAL  2005  ? right breast-benign  ? ? ?Family History  ?Problem Relation Age of Onset  ?? Diabetes Mother   ?? Depression Mother   ?? Anxiety disorder Mother   ?? Depression Father   ?? Hypertension Father   ?? Diabetes Father   ?? Bipolar disorder Father   ?? Hyperlipidemia Father   ?? Diabetes Maternal Grandmother   ?? Melanoma Maternal Grandmother   ?? Diabetes Maternal Grandfather   ?? Hypertension Paternal Grandmother   ?? Hyperlipidemia Paternal Grandmother   ?? Hypertension Paternal Grandfather   ?? Hyperlipidemia Paternal Grandfather   ?? Breast cancer Paternal Aunt   ?     great aunt, age 29  ? ? ?Social History  ? ?Socioeconomic History  ?? Marital  status: Married  ?  Spouse name: Chrissie NoaWilliam  ?? Number of children: 2  ?? Years of education: 4216  ?? Highest education level: Bachelor's degree (e.g., BA, AB, BS)  ?Occupational History  ?? Occupation: Marketing executivefitness instructor  ?Tobacco Use  ?? Smoking status: Former  ?  Types: Cigarettes  ?  Quit date: 07/26/2007  ?  Years since quitting: 13.9  ?  Passive exposure: Never  ?? Smokeless tobacco: Never  ?Vaping Use  ?? Vaping Use: Never used  ?Substance and Sexual Activity  ?? Alcohol use: No  ?  Alcohol/week: 0.0 standard drinks  ?  Comment: social  ?? Drug use: No  ?? Sexual activity: Not Currently  ?  Partners: Male  ?  Birth control/protection: None   ?Other Topics Concern  ?? Not on file  ?Social History Narrative  ? 37 y/o boy and 36 y/o female  ? Married  ? Fitness instructor  ? Son with growth hormone disorder  ? ?Social Determinants of Health  ? ?Financial Resource Strain: Not on file  ?Food Insecurity: Not on file  ?Transportation Needs: Not on file  ?Physical Activity: Not on file  ?Stress: Not on file  ?Social Connections: Not on file  ?Intimate Partner Violence: Not on file  ? ? ?Outpatient Medications Prior to Visit  ?Medication Sig Dispense Refill  ?? cetirizine (ZYRTEC) 10 MG tablet Take 10 mg by mouth daily.    ?? Cholecalciferol (VITAMIN D3) 50 MCG (2000 UT) capsule Take 2,000 Units by mouth daily.    ?? cyanocobalamin 1000 MCG tablet Take 1,000 mcg by mouth daily.    ?? Specialty Vitamins Products (BIOTIN PLUS KERATIN) 10000-100 MCG-MG TABS Take by mouth.    ?? Biotin 1610910000 MCG TBDP Take by mouth.    ?? Cholecalciferol (D3 SUPER STRENGTH) 50 MCG (2000 UT) CAPS Take by mouth.    ?? acetaminophen (TYLENOL) 500 MG tablet Take 1,000 mg by mouth every 6 (six) hours as needed for mild pain. (Patient not taking: Reported on 11/07/2020)    ?? busPIRone (BUSPAR) 7.5 MG tablet Take 1 tablet (7.5 mg total) by mouth 2 (two) times daily. (Patient not taking: Reported on 11/07/2020) 60 tablet 1  ?? Prenatal Vit-Fe Fumarate-FA (MULTIVITAMIN-PRENATAL) 27-0.8 MG TABS tablet Take 1 tablet by mouth daily at 12 noon. (Patient not taking: Reported on 11/07/2020)    ?? sulfamethoxazole-trimethoprim (BACTRIM DS,SEPTRA DS) 800-160 MG tablet Take 1 tablet by mouth 2 (two) times daily. (Patient not taking: Reported on 11/07/2020) 10 tablet 0  ? ?No facility-administered medications prior to visit.  ? ? ?Allergies  ?Allergen Reactions  ?? Bee Pollen Anaphylaxis  ?? Cherry Flavor [Flavoring Agent] Anaphylaxis  ?? Insect Extract Allergy Skin Test Anaphylaxis  ?? Penicillin G Anaphylaxis  ?? Penicillins Anaphylaxis  ?  Has patient had a PCN reaction causing immediate rash,  facial/tongue/throat swelling, SOB or lightheadedness with hypotension: Yes ?Has patient had a PCN reaction causing severe rash involving mucus membranes or skin necrosis: Yes ?Has patient had a PCN reaction that required hospitalization: No ?Has patient had a PCN reaction occurring within the last 10 years: Yes ?If all of the above answers are "NO", then may proceed with Cephalosporin use. ?  ?? Adhesive [Tape]   ?? Amoxicillin-Pot Clavulanate Hives  ?? Red Dye Other (See Comments)  ?  Gi upset  ?? Latex Rash  ? ? ?ROS ?Review of Systems  ?Constitutional:  Positive for unexpected weight change (weight gain despite frequent exercise and working on diet). Negative  for chills, fatigue and fever.  ?HENT:  Negative for sinus pressure and sinus pain.   ?Eyes:  Negative for visual disturbance.  ?Respiratory:  Negative for shortness of breath.   ?Cardiovascular:  Negative for chest pain.  ?Gastrointestinal:  Negative for abdominal pain.  ?Genitourinary:  Positive for menstrual problem. Negative for difficulty urinating, dysuria, frequency, vaginal bleeding, vaginal discharge and vaginal pain.  ?     Crying at times, doesn't feel related to depression, hormonal possible ?  ?Skin:  Positive for rash (acne on face with facial hair on chin).  ?Neurological:  Negative for dizziness and headaches.  ?Psychiatric/Behavioral:  Negative for agitation, decreased concentration, self-injury, sleep disturbance and suicidal ideas. The patient is not nervous/anxious.   ? ? ?  ?Objective:  ?  ?Physical Exam ?Vitals reviewed.  ?Constitutional:   ?   General: She is not in acute distress. ?   Appearance: Normal appearance. She is not ill-appearing or toxic-appearing.  ?HENT:  ?   Right Ear: Tympanic membrane normal.  ?   Left Ear: Tympanic membrane normal.  ?   Mouth/Throat:  ?   Mouth: Mucous membranes are moist.  ?   Pharynx: No pharyngeal swelling.  ?   Tonsils: No tonsillar exudate.  ?Eyes:  ?   Extraocular Movements: Extraocular  movements intact.  ?   Conjunctiva/sclera: Conjunctivae normal.  ?   Pupils: Pupils are equal, round, and reactive to light.  ?Neck:  ?   Thyroid: Thyromegaly present. No thyroid mass or thyroid tenderness.  ?Cardiovascul

## 2021-07-14 ENCOUNTER — Other Ambulatory Visit: Payer: Self-pay | Admitting: Family

## 2021-07-14 ENCOUNTER — Encounter: Payer: Self-pay | Admitting: Family

## 2021-07-14 DIAGNOSIS — E559 Vitamin D deficiency, unspecified: Secondary | ICD-10-CM

## 2021-07-14 DIAGNOSIS — R635 Abnormal weight gain: Secondary | ICD-10-CM

## 2021-07-14 DIAGNOSIS — Z6839 Body mass index (BMI) 39.0-39.9, adult: Secondary | ICD-10-CM

## 2021-07-14 DIAGNOSIS — E669 Obesity, unspecified: Secondary | ICD-10-CM

## 2021-07-14 MED ORDER — VITAMIN D (ERGOCALCIFEROL) 1.25 MG (50000 UNIT) PO CAPS: 50000.0000 [IU] | ORAL_CAPSULE | ORAL | 0 refills | Status: AC

## 2021-07-15 DIAGNOSIS — N920 Excessive and frequent menstruation with regular cycle: Secondary | ICD-10-CM | POA: Insufficient documentation

## 2021-07-15 DIAGNOSIS — L700 Acne vulgaris: Secondary | ICD-10-CM | POA: Insufficient documentation

## 2021-07-15 DIAGNOSIS — L680 Hirsutism: Secondary | ICD-10-CM | POA: Insufficient documentation

## 2021-07-15 DIAGNOSIS — E049 Nontoxic goiter, unspecified: Secondary | ICD-10-CM | POA: Insufficient documentation

## 2021-07-15 NOTE — Assessment & Plan Note (Signed)
Goiter on physical exam ?Ordering thyroid panel and ordering thyroid US ?Pending results ?

## 2021-07-15 NOTE — Assessment & Plan Note (Signed)
eval for hyperandrogenism, ordering hormone panel ?

## 2021-07-15 NOTE — Assessment & Plan Note (Addendum)
Suspected pcos  ?Workup with labs pending ? ?Total time spent 54 minutes in office going over acute chronic concerns, going over history, and reviewing ordering lab work  ?

## 2021-07-15 NOTE — Assessment & Plan Note (Signed)
Lipid panel ordered pending results.   

## 2021-07-15 NOTE — Assessment & Plan Note (Signed)
Testosterone ordered pending results ?Suspected pcos ?

## 2021-07-15 NOTE — Assessment & Plan Note (Signed)
R/o anemia/thyroid disease ?Ordering thyroid panel and cbc ?

## 2021-07-15 NOTE — Assessment & Plan Note (Signed)
vitamin d ordered pending results ?

## 2021-07-15 NOTE — Assessment & Plan Note (Addendum)
Thyroid panel w/u labs ordered pending results ?May consider weight loss medication if negative workup ?

## 2021-07-17 LAB — THYROID PEROXIDASE ANTIBODIES (TPO) (REFL): Thyroperoxidase Ab SerPl-aCnc: <1 IU/mL (ref <9)

## 2021-07-17 LAB — PROLACTIN: Prolactin: 6.4 ng/mL

## 2021-07-17 LAB — TESTOS,TOTAL,FREE AND SHBG (FEMALE)
Free Testosterone: 4.3 pg/mL (ref 0.1–6.4)
Sex Hormone Binding: 36 nmol/L (ref 17–124)
Testosterone, Total, LC-MS-MS: 28 ng/dL (ref 2–45)

## 2021-07-17 MED ORDER — QSYMIA 3.75-23 MG PO CP24: ORAL_CAPSULE | ORAL | 0 refills | Status: DC

## 2021-07-18 ENCOUNTER — Other Ambulatory Visit: Payer: Self-pay | Admitting: Family

## 2021-07-18 DIAGNOSIS — E669 Obesity, unspecified: Secondary | ICD-10-CM

## 2021-07-18 DIAGNOSIS — Z6839 Body mass index (BMI) 39.0-39.9, adult: Secondary | ICD-10-CM

## 2021-07-18 DIAGNOSIS — R635 Abnormal weight gain: Secondary | ICD-10-CM

## 2021-07-18 MED ORDER — PHENTERMINE HCL 37.5 MG PO CAPS: 37.5000 mg | ORAL_CAPSULE | ORAL | 0 refills | Status: DC

## 2021-07-18 MED ORDER — PHENTERMINE HCL 15 MG PO CAPS: 15.0000 mg | ORAL_CAPSULE | ORAL | 0 refills | Status: DC

## 2021-07-24 ENCOUNTER — Telehealth: Payer: Self-pay

## 2021-07-24 NOTE — Telephone Encounter (Signed)
Called patient reviewed all information and repeated back to me. Will call if any questions.  Has 3 month appt already set up. ? ?

## 2021-07-25 ENCOUNTER — Ambulatory Visit: Payer: BC Managed Care – PPO | Admitting: Adult Health

## 2021-08-16 ENCOUNTER — Ambulatory Visit: Payer: BC Managed Care – PPO | Admitting: Family

## 2021-08-16 ENCOUNTER — Encounter: Payer: Self-pay | Admitting: Family

## 2021-08-16 VITALS — BP 112/68 | HR 82 | Temp 98.0°F | Resp 16 | Ht 64.0 in | Wt 225.1 lb

## 2021-08-16 DIAGNOSIS — E669 Obesity, unspecified: Secondary | ICD-10-CM

## 2021-08-16 DIAGNOSIS — F411 Generalized anxiety disorder: Secondary | ICD-10-CM | POA: Diagnosis not present

## 2021-08-16 DIAGNOSIS — Z6839 Body mass index (BMI) 39.0-39.9, adult: Secondary | ICD-10-CM

## 2021-08-16 DIAGNOSIS — R635 Abnormal weight gain: Secondary | ICD-10-CM

## 2021-08-16 MED ORDER — PHENTERMINE HCL 37.5 MG PO CAPS: 37.5000 mg | ORAL_CAPSULE | ORAL | 0 refills | Status: DC

## 2021-08-16 MED ORDER — BUSPIRONE HCL 7.5 MG PO TABS: 7.5000 mg | ORAL_TABLET | Freq: Every day | ORAL | 0 refills | Status: DC

## 2021-08-16 NOTE — Progress Notes (Signed)
? ?Established Patient Office Visit ? ?Subjective:  ?Patient ID: Brenda Gutierrez, female    DOB: Dec 31, 1985  Age: 36 y.o. MRN: [id number] ? ?CC:  ?Chief Complaint  ?Patient presents with  ? medication check  ? ? ?HPI ?Brenda Gutierrez is here today for follow up.  ?Pt is without acute concerns. ? ?Obesity: on phentermine 37.5 mg, has been on for the last three weeks.  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 ?Wt Readings from Last 3 Encounters:  ?08/16/21 225 lb 2 oz (102.1 kg)  ?07/12/21 229 lb (103.9 kg)  ?11/07/20 207 lb 3.2 oz (94 kg)  ? ?Increased anxiety: pt was on buspirone in the past, wanted to restart as starting with increased stress and anxiety as of late. Tolerated well in the past. D/c only bc wasn't really struggling at the time anymore. ? ?  08/16/2021  ? 11:12 AM  ?GAD 7 : Generalized Anxiety Score  ?Nervous, Anxious, on Edge 2  ?Control/stop worrying 1  ?Worry too much - different things 2  ?Trouble relaxing 1  ?Restless 2  ?Easily annoyed or irritable 3  ?Afraid - awful might happen 0  ?Total GAD 7 Score 11  ?Anxiety Difficulty Somewhat difficult  ? ? ? ?  08/16/2021  ? 11:12 AM 03/23/2018  ?  3:00 PM 02/23/2018  ?  2:37 PM  ?PHQ9 SCORE ONLY  ?PHQ-9 Total Score 2 6 11   ? ? ? ?Past Medical History:  ?Diagnosis Date  ? Common migraine with intractable migraine 01/04/2014  ? Headache   ? migraines  ? History of mastitis 03/13/2018  ? Morbid obesity ([place])   ? ? ?Past Surgical History:  ?Procedure Laterality Date  ? CESAREAN SECTION N/A 07/21/2015  ? Procedure: CESAREAN SECTION;  Surgeon: Rubie Maid, MD;  Location: [facility];  Service: Obstetrics;  Laterality: N/A;  ? CESAREAN SECTION N/A 11/29/2017  ? Procedure: CESAREAN SECTION;  Surgeon: Jonnie Kind, MD;  Location: [place];  Service: Obstetrics;   Laterality: N/A;  ? OVARIAN CYST REMOVAL Left   ? TONSILLECTOMY    ? TUBAL LIGATION Bilateral 11/29/2017  ? Procedure: BILATERAL TUBAL LIGATION;  Surgeon: Jonnie Kind, MD;  Location: [place];  Service: Obstetrics;  Laterality: Bilateral;  Filshie Clips  ? TUMOR REMOVAL  2003  ? throat-benign  ? TUMOR REMOVAL  2005  ? right breast-benign  ? ? ?Family History  ?Problem Relation Age of Onset  ? Diabetes Mother   ? Depression Mother   ? Anxiety disorder Mother   ? Depression Father   ? Hypertension Father   ? Diabetes Father   ? Bipolar disorder Father   ? Hyperlipidemia Father   ? Diabetes Maternal Grandmother   ? Melanoma Maternal Grandmother   ? Diabetes Maternal Grandfather   ? Hypertension Paternal Grandmother   ? Hyperlipidemia Paternal Grandmother   ? Hypertension Paternal Grandfather   ? Hyperlipidemia Paternal Grandfather   ? Breast cancer Paternal Aunt   ?     great aunt, age 50  ? ? ?Social History  ? ?Socioeconomic History  ? Marital status: Married  ?  Spouse name: Brenda Gutierrez  ? Number of children: 2  ? Years of education: 18  ? Highest education level: Bachelor's degree (e.g., BA, AB, BS)  ?Occupational History  ?  Occupation: Marketing executive  ?Tobacco Use  ? Smoking status: Former  ?  Types: Cigarettes  ?  Quit date: 07/26/2007  ?  Years since quitting: 14.0  ?  Passive exposure: Never  ? Smokeless tobacco: Never  ?Vaping Use  ? Vaping Use: Never used  ?Substance and Sexual Activity  ? Alcohol use: No  ?  Alcohol/week: 0.0 standard drinks  ?  Comment: social  ? Drug use: No  ? Sexual activity: Not Currently  ?  Partners: Male  ?  Birth control/protection: None  ?Other Topics Concern  ? Not on file  ?Social History Narrative  ? 36 y/o boy and 36 y/o female  ? Married  ? Fitness instructor  ? Son with growth hormone disorder  ? ?Social Determinants of Health  ? ?Financial Resource Strain: Not on file  ?Food Insecurity: Not on file  ?Transportation Needs: Not on file  ?Physical Activity: Not on  file  ?Stress: Not on file  ?Social Connections: Not on file  ?Intimate Partner Violence: Not on file  ? ? ?Outpatient Medications Prior to Visit  ?Medication Sig Dispense Refill  ? cetirizine (ZYRTEC) 10 MG tablet Take 10 mg by mouth daily.    ? Cholecalciferol (VITAMIN D3) 50 MCG (2000 UT) capsule Take 2,000 Units by mouth daily.    ? cyanocobalamin 1000 MCG tablet Take 1,000 mcg by mouth daily.    ? Specialty Vitamins Products (BIOTIN PLUS KERATIN) 10000-100 MCG-MG TABS Take by mouth.    ? Vitamin D, Ergocalciferol, (DRISDOL) 1.25 MG (50000 UNIT) CAPS capsule Take 1 capsule (50,000 Units total) by mouth every 7 (seven) days for 8 doses. 8 capsule 0  ? phentermine 37.5 MG capsule Take 1 capsule (37.5 mg total) by mouth every morning. Start at week two, will take 15 mg once daily week one 30 capsule 0  ? phentermine 15 MG capsule Take 1 capsule (15 mg total) by mouth every morning for 7 days. 7 capsule 0  ? ?No facility-administered medications prior to visit.  ? ? ?Allergies  ?Allergen Reactions  ? Bee Pollen Anaphylaxis  ? Cherry Flavor [Flavoring Agent] Anaphylaxis  ? Insect Extract Allergy Skin Test Anaphylaxis  ? Penicillin G Anaphylaxis  ? Penicillins Anaphylaxis  ?  Has patient had a PCN reaction causing immediate rash, facial/tongue/throat swelling, SOB or lightheadedness with hypotension: Yes ?Has patient had a PCN reaction causing severe rash involving mucus membranes or skin necrosis: Yes ?Has patient had a PCN reaction that required hospitalization: No ?Has patient had a PCN reaction occurring within the last 10 years: Yes ?If all of the above answers are "NO", then may proceed with Cephalosporin use. ?  ? Adhesive [Tape]   ? Amoxicillin-Pot Clavulanate Hives  ? Red Dye Other (See Comments)  ?  Gi upset  ? Latex Rash  ? ? ?ROS ?Review of Systems  ?Constitutional:  Negative for chills, fatigue, fever and unexpected weight change (some weight loss).  ?Eyes:  Negative for visual disturbance.  ?Respiratory:   Negative for shortness of breath.   ?Cardiovascular:  Negative for chest pain, palpitations and leg swelling.  ?Gastrointestinal:  Negative for abdominal pain.  ?Genitourinary:  Negative for difficulty urinating.  ?Skin:  Negative for rash.  ?Neurological:  Negative for dizziness and headaches.  ?Psychiatric/Behavioral:  Negative for behavioral problems, self-injury, sleep disturbance and suicidal ideas. The patient is nervous/anxious.   ? ? ? ?  ?Objective:  ?  ?Physical Exam ?Vitals reviewed.  ?Constitutional:   ?  General: She is not in acute distress. ?   Appearance: Normal appearance. She is obese. She is not ill-appearing or toxic-appearing.  ?HENT:  ?   Mouth/Throat:  ?   Pharynx: No pharyngeal swelling.  ?   Tonsils: No tonsillar exudate.  ?Neck:  ?   Thyroid: No thyroid mass.  ?Cardiovascular:  ?   Rate and Rhythm: Normal rate and regular rhythm.  ?Pulmonary:  ?   Effort: Pulmonary effort is normal.  ?Musculoskeletal:     ?   General: Normal range of motion.  ?Lymphadenopathy:  ?   Cervical:  ?   Right cervical: No superficial cervical adenopathy. ?   Left cervical: No superficial cervical adenopathy.  ?Skin: ?   General: Skin is warm.  ?Neurological:  ?   General: No focal deficit present.  ?   Mental Status: She is alert and oriented to person, place, and time.  ?Psychiatric:     ?   Mood and Affect: Mood normal.     ?   Behavior: Behavior normal.     ?   Thought Content: Thought content normal.     ?   Judgment: Judgment normal.  ? ? ? ? ?BP 112/68   Pulse 82   Temp 98 ?F (36.7 ?C)   Resp 16   Ht 5\' 4"  (1.626 m)   Wt 225 lb 2 oz (102.1 kg)   LMP 08/02/2021   SpO2 99%   Breastfeeding No   BMI 38.64 kg/m?  ?Wt Readings from Last 3 Encounters:  ?08/16/21 225 lb 2 oz (102.1 kg)  ?07/12/21 229 lb (103.9 kg)  ?11/07/20 207 lb 3.2 oz (94 kg)  ? ? ? ?Health Maintenance Due  ?Topic Date Due  ? COVID-19 Vaccine (1) Never done  ? Hepatitis C Screening  Never done  ? ? ?There are no preventive care  reminders to display for this patient. ? ?Lab Results  ?Component Value Date  ? TSH 1.54 07/12/2021  ? ?Lab Results  ?Component Value Date  ? WBC 7.9 07/12/2021  ? HGB 12.3 07/12/2021  ? HCT 37.3 07/12/2021  ? MCV 83.5 03

## 2021-08-16 NOTE — Patient Instructions (Signed)
Sending in buspar 7.5 mg once daily.  ?Work on anxiety reducing techniques.  ? ?Refill sent for phentermine.   ? ?Due to recent changes in healthcare laws, you may see results of your imaging and/or laboratory studies on MyChart before I have had a chance to review them.  I understand that in some cases there may be results that are confusing or concerning to you. Please understand that not all results are received at the same time and often I may need to interpret multiple results in order to provide you with the best plan of care or course of treatment. Therefore, I ask that you please give me 2 business days to thoroughly review all your results before contacting my office for clarification. Should we see a critical lab result, you will be contacted sooner.  ? ?It was a pleasure seeing you today! Please do not hesitate to reach out with any questions and or concerns. ? ?Regards,  ? ?Denita Lun ?FNP-C ? ?

## 2021-08-17 ENCOUNTER — Encounter: Payer: Self-pay | Admitting: Family

## 2021-08-17 ENCOUNTER — Other Ambulatory Visit: Payer: Self-pay | Admitting: Family

## 2021-08-17 ENCOUNTER — Telehealth: Payer: Self-pay

## 2021-08-17 DIAGNOSIS — E669 Obesity, unspecified: Secondary | ICD-10-CM | POA: Insufficient documentation

## 2021-08-17 DIAGNOSIS — F411 Generalized anxiety disorder: Secondary | ICD-10-CM | POA: Insufficient documentation

## 2021-08-17 NOTE — Assessment & Plan Note (Signed)
Refill phentermine 37.5 mg  ?Reviewed pdmp no suspicious activity  ? ?

## 2021-08-17 NOTE — Telephone Encounter (Signed)
Received by insurance will get response in up to 72 hours  ?

## 2021-08-17 NOTE — Telephone Encounter (Signed)
PA started see notes. ? ?

## 2021-08-20 ENCOUNTER — Telehealth: Payer: Self-pay

## 2021-08-21 ENCOUNTER — Other Ambulatory Visit: Payer: Self-pay | Admitting: Family

## 2021-08-21 MED ORDER — BUSPIRONE HCL 5 MG PO TABS: 5.0000 mg | ORAL_TABLET | Freq: Two times a day (BID) | ORAL | 2 refills | Status: DC

## 2021-08-21 NOTE — Telephone Encounter (Signed)
Received following information in fax from insurance.  ? ? ?

## 2021-08-24 DIAGNOSIS — M79675 Pain in left toe(s): Secondary | ICD-10-CM | POA: Diagnosis not present

## 2021-08-24 NOTE — Assessment & Plan Note (Signed)
Pt was on buspirone in the past, will attempt to send rx for buspirone 7.5 to take once daily (pt no desire to take twice daily) for now. Sending in, work on anxiety reducing techniques. Handout sent to mychart.  ?

## 2021-08-30 ENCOUNTER — Other Ambulatory Visit: Payer: Self-pay | Admitting: Family

## 2021-08-30 DIAGNOSIS — F411 Generalized anxiety disorder: Secondary | ICD-10-CM

## 2021-08-30 MED ORDER — BUSPIRONE HCL 5 MG PO TABS: 5.0000 mg | ORAL_TABLET | Freq: Two times a day (BID) | ORAL | 2 refills | Status: DC

## 2021-09-18 ENCOUNTER — Ambulatory Visit: Payer: BC Managed Care – PPO | Admitting: Family

## 2021-09-18 ENCOUNTER — Encounter: Payer: Self-pay | Admitting: Family

## 2021-09-18 DIAGNOSIS — E669 Obesity, unspecified: Secondary | ICD-10-CM | POA: Diagnosis not present

## 2021-09-18 DIAGNOSIS — E559 Vitamin D deficiency, unspecified: Secondary | ICD-10-CM

## 2021-09-18 DIAGNOSIS — Z6839 Body mass index (BMI) 39.0-39.9, adult: Secondary | ICD-10-CM | POA: Diagnosis not present

## 2021-09-18 DIAGNOSIS — R635 Abnormal weight gain: Secondary | ICD-10-CM | POA: Diagnosis not present

## 2021-09-18 DIAGNOSIS — F411 Generalized anxiety disorder: Secondary | ICD-10-CM

## 2021-09-18 MED ORDER — PHENTERMINE HCL 37.5 MG PO CAPS: 37.5000 mg | ORAL_CAPSULE | ORAL | 0 refills | Status: DC

## 2021-09-18 NOTE — Assessment & Plan Note (Signed)
Continue vitamin D3 2000 IU once daily

## 2021-09-18 NOTE — Patient Instructions (Signed)
Due to recent changes in healthcare laws, you may see results of your imaging and/or laboratory studies on MyChart before I have had a chance to review them.  I understand that in some cases there may be results that are confusing or concerning to you. Please understand that not all results are received at the same time and often I may need to interpret multiple results in order to provide you with the best plan of care or course of treatment. Therefore, I ask that you please give me 2 business days to thoroughly review all your results before contacting my office for clarification. Should we see a critical lab result, you will be contacted sooner.   It was a pleasure seeing you today! Please do not hesitate to reach out with any questions and or concerns.  Regards,   Josie Mesa FNP-C  

## 2021-09-18 NOTE — Progress Notes (Signed)
Established Patient Office Visit  Subjective:  Patient ID: Brenda Gutierrez, female    DOB: Mar 06, 1986  Age: 36 y.o. MRN: [id number]  CC:  Chief Complaint  Patient presents with   Medication Screening    HPI Brenda Gutierrez is here today for follow up.  Pt is without acute concerns.  GAD: taking 5 mg once daily, sometimes will take 5 mg at night if she forgets. Goal is 5 mg twice daily. Seems to be helping with her anxiety, and she is happy with effects. States ' just enough to take the edge off'.   Obesity: has lost three pounds since her last visit. Was 219 on Friday at home. Taking 37.5 mg once daily and tolerating well. No cp palp or sob.  Wt Readings from Last 3 Encounters:  09/18/21 222 lb 8 oz (100.9 kg)  08/16/21 225 lb 2 oz (102.1 kg)  07/12/21 229 lb (103.9 kg)   Vitamin d def: starting daily vitamin D3 2000 IU once daily.  Added vitamin C daily as well.   Past Medical History:  Diagnosis Date   Common migraine with intractable migraine 01/04/2014   Headache    migraines   History of mastitis 03/13/2018   Morbid obesity ([facility])     Past Surgical History:  Procedure Laterality Date   CESAREAN SECTION N/A 07/21/2015   Procedure: CESAREAN SECTION;  Surgeon: Hildred LaserAnika Cherry, MD;  Location: [facility];  Service: Obstetrics;  Laterality: N/A;   CESAREAN SECTION N/A 11/29/2017   Procedure: CESAREAN SECTION;  Surgeon: Tilda BurrowFerguson, John V, MD;  [facility];  Service: Obstetrics;  Laterality: N/A;   OVARIAN CYST REMOVAL Left    TONSILLECTOMY     TUBAL LIGATION Bilateral 11/29/2017   Procedure: BILATERAL TUBAL LIGATION;  Surgeon: Tilda BurrowFerguson, John V, MD;  [facility];  Service: Obstetrics;  Laterality: Bilateral;  Filshie Clips   TUMOR REMOVAL  2003   throat-benign   TUMOR REMOVAL  2005   right breast-benign    Family History  Problem Relation Age of Onset   Diabetes Mother    Depression Mother    Anxiety disorder Mother    Depression  Father    Hypertension Father    Diabetes Father    Bipolar disorder Father    Hyperlipidemia Father    Diabetes Maternal Grandmother    Melanoma Maternal Grandmother    Diabetes Maternal Grandfather    Hypertension Paternal Grandmother    Hyperlipidemia Paternal Grandmother    Hypertension Paternal Grandfather    Hyperlipidemia Paternal Grandfather    Breast cancer Paternal Aunt        great aunt, age 37    Social History   Socioeconomic History   Marital status: Married    Spouse name: Chrissie NoaWilliam   Number of children: 2   Years of education: 16   Highest education level: Bachelor's degree (e.g., BA, AB, BS)  Occupational History   Occupation: fitness instructor  Tobacco Use   Smoking status: Former    Types: Cigarettes    Quit date: 07/26/2007    Years since quitting: 14.1    Passive exposure: Never   Smokeless tobacco: Never  Vaping Use   Vaping Use: Never used  Substance and Sexual Activity   Alcohol use: No    Alcohol/week: 0.0 standard drinks    Comment: social   Drug use: No   Sexual activity: Not Currently    Partners: Male    Birth control/protection: None  Other Topics  Concern   Not on file  Social History Narrative   36 y/o boy and 36 y/o female   Married   Marketing executive   Son with growth hormone disorder   Social Determinants of Corporate investment banker Strain: Not on file  Food Insecurity: Not on file  Transportation Needs: Not on file  Physical Activity: Not on file  Stress: Not on file  Social Connections: Not on file  Intimate Partner Violence: Not on file    Outpatient Medications Prior to Visit  Medication Sig Dispense Refill   Ascorbic Acid (VITAMIN C) 1000 MG tablet Take 1,000 mg by mouth daily.     busPIRone (BUSPAR) 5 MG tablet Take 1 tablet (5 mg total) by mouth 2 (two) times daily. 60 tablet 2   cetirizine (ZYRTEC) 10 MG tablet Take 10 mg by mouth daily.     Cholecalciferol (VITAMIN D3) 50 MCG (2000 UT) capsule Take 2,000  Units by mouth daily.     cyanocobalamin 1000 MCG tablet Take 1,000 mcg by mouth daily.     Specialty Vitamins Products (BIOTIN PLUS KERATIN) 10000-100 MCG-MG TABS Take by mouth.     phentermine 37.5 MG capsule Take 1 capsule (37.5 mg total) by mouth every morning. 30 capsule 0   No facility-administered medications prior to visit.    Allergies  Allergen Reactions   Bee Pollen Anaphylaxis   Armed forces technical officer Agent] Anaphylaxis   Insect Extract Allergy Skin Test Anaphylaxis   Penicillin G Anaphylaxis   Penicillins Anaphylaxis    Has patient had a PCN reaction causing immediate rash, facial/tongue/throat swelling, SOB or lightheadedness with hypotension: Yes Has patient had a PCN reaction causing severe rash involving mucus membranes or skin necrosis: Yes Has patient had a PCN reaction that required hospitalization: No Has patient had a PCN reaction occurring within the last 10 years: Yes If all of the above answers are "NO", then may proceed with Cephalosporin use.    Adhesive [Tape]    Amoxicillin-Pot Clavulanate Hives   Red Dye Other (See Comments)    Gi upset   Latex Rash        Objective:    Physical Exam Vitals reviewed.  Constitutional:      General: She is not in acute distress.    Appearance: Normal appearance. She is obese. She is not ill-appearing, toxic-appearing or diaphoretic.  Cardiovascular:     Rate and Rhythm: Normal rate and regular rhythm.  Pulmonary:     Effort: Pulmonary effort is normal.  Neurological:     Mental Status: She is alert.  Psychiatric:        Mood and Affect: Mood normal.        Behavior: Behavior normal.        Thought Content: Thought content normal.        Judgment: Judgment normal.     BP 128/90   Pulse 85   Temp 98.2 F (36.8 C)   Resp 16   Ht 5\' 4"  (1.626 m)   Wt 222 lb 8 oz (100.9 kg)   LMP 08/19/2021   SpO2 98%   BMI 38.19 kg/m  Wt Readings from Last 3 Encounters:  09/18/21 222 lb 8 oz (100.9 kg)   08/16/21 225 lb 2 oz (102.1 kg)  07/12/21 229 lb (103.9 kg)     Health Maintenance Due  Topic Date Due   COVID-19 Vaccine (1) Never done   Hepatitis C Screening  Never done    There are  no preventive care reminders to display for this patient.  Lab Results  Component Value Date   TSH 1.54 07/12/2021   Lab Results  Component Value Date   WBC 7.9 07/12/2021   HGB 12.3 07/12/2021   HCT 37.3 07/12/2021   MCV 83.5 07/12/2021   PLT 261.0 07/12/2021   Lab Results  Component Value Date   NA 140 07/12/2021   K 4.3 07/12/2021   CO2 28 07/12/2021   GLUCOSE 99 07/12/2021   BUN 16 07/12/2021   CREATININE 0.67 07/12/2021   BILITOT 0.7 07/12/2021   ALKPHOS 64 07/12/2021   AST 18 07/12/2021   ALT 19 07/12/2021   PROT 6.6 07/12/2021   ALBUMIN 4.2 07/12/2021   CALCIUM 9.0 07/12/2021   ANIONGAP 9 06/26/2015   GFR 112.45 07/12/2021   Lab Results  Component Value Date   CHOL 213 (H) 07/12/2021   Lab Results  Component Value Date   HDL 66.70 07/12/2021   Lab Results  Component Value Date   LDLCALC 130 (H) 07/12/2021   Lab Results  Component Value Date   TRIG 80.0 07/12/2021   Lab Results  Component Value Date   CHOLHDL 3 07/12/2021   Lab Results  Component Value Date   HGBA1C 5.3 05/02/2017      Assessment & Plan:   Problem List Items Addressed This Visit       Other   Vitamin D deficiency    Continue vitamin D3 2000 IU once daily       BMI 39.0-39.9,adult   Relevant Medications   phentermine 37.5 MG capsule   Obesity (BMI 30-39.9)    Refill phentermine 37.5 mg once daily Reviewed pdmp no suspicious activity Monitor for side effects to include high blood pressure, high heart rate, agitation Work on diet and exercise       Relevant Medications   phentermine 37.5 MG capsule   Generalized anxiety disorder    Continue 5 mg buspirone twice daily. Work on anxiety reducing techniques       Other Visit Diagnoses     Abnormal weight gain        Relevant Medications   phentermine 37.5 MG capsule       Meds ordered this encounter  Medications   phentermine 37.5 MG capsule    Sig: Take 1 capsule (37.5 mg total) by mouth every morning.    Dispense:  30 capsule    Refill:  0    Order Specific Question:   Supervising Provider    Answer:   Ermalene Searing, AMY E [[id number]]    Follow-up: Return in about 1 month (around 10/19/2021) for for follow up on medication.    Mort Sawyers, FNP

## 2021-09-18 NOTE — Assessment & Plan Note (Signed)
Continue 5 mg buspirone twice daily. Work on anxiety reducing techniques

## 2021-09-18 NOTE — Assessment & Plan Note (Signed)
Refill phentermine 37.5 mg once daily Reviewed pdmp no suspicious activity Monitor for side effects to include high blood pressure, high heart rate, agitation Work on diet and exercise

## 2021-10-15 ENCOUNTER — Ambulatory Visit: Payer: BC Managed Care – PPO | Admitting: Family

## 2021-10-19 ENCOUNTER — Encounter: Payer: Self-pay | Admitting: Family

## 2021-10-19 ENCOUNTER — Ambulatory Visit: Payer: BC Managed Care – PPO | Admitting: Family

## 2021-10-19 VITALS — BP 118/78 | HR 97 | Temp 98.6°F | Resp 16 | Ht 64.0 in | Wt 222.5 lb

## 2021-10-19 DIAGNOSIS — L680 Hirsutism: Secondary | ICD-10-CM

## 2021-10-19 DIAGNOSIS — J029 Acute pharyngitis, unspecified: Secondary | ICD-10-CM | POA: Diagnosis not present

## 2021-10-19 DIAGNOSIS — E669 Obesity, unspecified: Secondary | ICD-10-CM | POA: Diagnosis not present

## 2021-10-19 DIAGNOSIS — F411 Generalized anxiety disorder: Secondary | ICD-10-CM | POA: Diagnosis not present

## 2021-10-19 DIAGNOSIS — E282 Polycystic ovarian syndrome: Secondary | ICD-10-CM | POA: Insufficient documentation

## 2021-10-19 DIAGNOSIS — N926 Irregular menstruation, unspecified: Secondary | ICD-10-CM | POA: Diagnosis not present

## 2021-10-19 DIAGNOSIS — Z6839 Body mass index (BMI) 39.0-39.9, adult: Secondary | ICD-10-CM

## 2021-10-19 DIAGNOSIS — N946 Dysmenorrhea, unspecified: Secondary | ICD-10-CM

## 2021-10-19 DIAGNOSIS — E559 Vitamin D deficiency, unspecified: Secondary | ICD-10-CM

## 2021-10-19 LAB — POCT RAPID STREP A (OFFICE): Rapid Strep A Screen: NEGATIVE

## 2021-10-19 LAB — POCT GLYCOSYLATED HEMOGLOBIN (HGB A1C): Hemoglobin A1C: 5.5 % (ref 4.0–5.6)

## 2021-10-19 MED ORDER — BUSPIRONE HCL 5 MG PO TABS: 5.0000 mg | ORAL_TABLET | Freq: Three times a day (TID) | ORAL | 2 refills | Status: AC

## 2021-10-19 MED ORDER — WEGOVY 0.25 MG/0.5ML ~~LOC~~ SOAJ: SUBCUTANEOUS | 0 refills | Status: DC

## 2021-10-19 NOTE — Assessment & Plan Note (Signed)
Continue otc vitamin D 

## 2021-10-19 NOTE — Progress Notes (Signed)
Established Patient Office Visit  Subjective:  Patient ID: Brenda Gutierrez, female    DOB: 02/04/1986  Age: 36 y.o. MRN: [id number]  CC:  Chief Complaint  Patient presents with   Medication Management    HPI Brenda Gutierrez is here today for follow up.   Pt is with acute concerns.  Obesity: end of third month has about seven days left.  Been tolerating well.  Wt Readings from Last 3 Encounters:  10/19/21 222 lb 8 oz (100.9 kg)  09/18/21 222 lb 8 oz (100.9 kg)  08/16/21 225 lb 2 oz (102.1 kg)   GAD: recent self increase in buspirone to 5 mg tid. Doing well with this.  Likes this dosage for now.   Pcos: interested in metformin if candidate.   Vitamin d def: taking 2000 IU once daily.   Left ear pain started one week ago, had strep prior to that. Nasal congestion pnd and also some left side jaw pain. No sore throat anymore. Was not treated for her strep. was seeing ent but booked out one year and hard to get appt. Has had two ruptures in the last three years, ent was considering a graft.   H/o ovarian cyst ruptures. Had been placed on hormonal birth control, didn't tolerate that well. Has had painful menses. Irregular menses as well. Wanted to see if she could try metformin. Recent hormones didn't show any abn. Not a recent transvaginal u/s    Past Medical History:  Diagnosis Date   Common migraine with intractable migraine 01/04/2014   Headache    migraines   History of mastitis 03/13/2018   Morbid obesity (HCC)     Past Surgical History:  Procedure Laterality Date   CESAREAN SECTION N/A 07/21/2015   Procedure: CESAREAN SECTION;  Surgeon: Hildred Laser, MD;  Location: [facility] ORS;  Service: Obstetrics;  Laterality: N/A;   CESAREAN SECTION N/A 11/29/2017   Procedure: CESAREAN SECTION;  Surgeon: Tilda Burrow, MD;  Location: [facility] BIRTHING SUIT[facility];  Service: Obstetrics;  Laterality: N/A;   OVARIAN CYST REMOVAL Left    TONSILLECTOMY     TUBAL LIGATION Bilateral  11/29/2017   Procedure: BILATERAL TUBAL LIGATION;  Surgeon: Tilda Burrow, MD;  Location: [facility] BIRTHING SUITES;  Service: Obstetrics;  Laterality: Bilateral;  Filshie Clips   TUMOR REMOVAL  2003   throat-benign   TUMOR REMOVAL  2005   right breast-benign    Family History  Problem Relation Age of Onset   Diabetes Mother    Depression Mother    Anxiety disorder Mother    Depression Father    Hypertension Father    Diabetes Father    Bipolar disorder Father    Hyperlipidemia Father    Diabetes Maternal Grandmother    Melanoma Maternal Grandmother    Diabetes Maternal Grandfather    Hypertension Paternal Grandmother    Hyperlipidemia Paternal Grandmother    Hypertension Paternal Grandfather    Hyperlipidemia Paternal Grandfather    Breast cancer Paternal Aunt        great aunt, age 28    Social History   Socioeconomic History   Marital status: Married    Spouse name: Chrissie Noa   Number of children: 2   Years of education: 16   Highest education level: Bachelor's degree (e.g., BA, AB, BS)  Occupational History   Occupation: fitness instructor  Tobacco Use   Smoking status: Former    Types: Cigarettes    Quit date: 07/26/2007    Years since  quitting: 14.2    Passive exposure: Never   Smokeless tobacco: Never  Vaping Use   Vaping Use: Never used  Substance and Sexual Activity   Alcohol use: No    Alcohol/week: 0.0 standard drinks of alcohol    Comment: social   Drug use: No   Sexual activity: Not Currently    Partners: Male    Birth control/protection: None  Other Topics Concern   Not on file  Social History Narrative   37 y/o boy and 36 y/o female   Married   Marketing executive   Son with growth hormone disorder   Social Determinants of Health   Financial Resource Strain: Low Risk  (11/28/2017)   Overall Financial Resource Strain (CARDIA)    Difficulty of Paying Living Expenses: Not hard at all  Food Insecurity: No Food Insecurity (11/28/2017)   Hunger Vital  Sign    Worried About Running Out of Food in the Last Year: Never true    Ran Out of Food in the Last Year: Never true  Transportation Needs: No Transportation Needs (11/28/2017)   PRAPARE - Administrator, Civil Service (Medical): No    Lack of Transportation (Non-Medical): No  Physical Activity: Sufficiently Active (11/28/2017)   Exercise Vital Sign    Days of Exercise per Week: 5 days    Minutes of Exercise per Session: 50 min  Stress: No Stress Concern Present (11/28/2017)   [employer] of Occupational Health - Occupational Stress Questionnaire    Feeling of Stress : Not at all  Social Connections: Moderately Integrated (11/28/2017)   Social Connection and Isolation Panel [NHANES]    Frequency of Communication with Friends and Family: More than three times a week    Frequency of Social Gatherings with Friends and Family: Once a week    Attends Religious Services: Never    Database administrator or Organizations: Yes    Attends Engineer, structural: More than 4 times per year    Marital Status: Married  Catering manager Violence: Not At Risk (11/28/2017)   Humiliation, Afraid, Rape, and Kick questionnaire    Fear of Current or Ex-Partner: No    Emotionally Abused: No    Physically Abused: No    Sexually Abused: No    Outpatient Medications Prior to Visit  Medication Sig Dispense Refill   Ascorbic Acid (VITAMIN C) 1000 MG tablet Take 1,000 mg by mouth daily.     cetirizine (ZYRTEC) 10 MG tablet Take 10 mg by mouth daily.     Cholecalciferol (VITAMIN D3) 50 MCG (2000 UT) capsule Take 2,000 Units by mouth daily.     cyanocobalamin 1000 MCG tablet Take 1,000 mcg by mouth daily.     Specialty Vitamins Products (BIOTIN PLUS KERATIN) 10000-100 MCG-MG TABS Take by mouth.     busPIRone (BUSPAR) 5 MG tablet Take 1 tablet (5 mg total) by mouth 2 (two) times daily. (Patient taking differently: Take 5 mg by mouth 3 (three) times daily.) 60 tablet 2   phentermine 37.5 MG  capsule Take 1 capsule (37.5 mg total) by mouth every morning. 30 capsule 0   No facility-administered medications prior to visit.    Allergies  Allergen Reactions   Bee Pollen Anaphylaxis   Armed forces technical officer Agent] Anaphylaxis   Insect Extract Allergy Skin Test Anaphylaxis   Penicillin G Anaphylaxis   Penicillins Anaphylaxis    Has patient had a PCN reaction causing immediate rash, facial/tongue/throat swelling, SOB or lightheadedness with hypotension:  Yes Has patient had a PCN reaction causing severe rash involving mucus membranes or skin necrosis: Yes Has patient had a PCN reaction that required hospitalization: No Has patient had a PCN reaction occurring within the last 10 years: Yes If all of the above answers are "NO", then may proceed with Cephalosporin use.    Adhesive [Tape]    Amoxicillin-Pot Clavulanate Hives   Red Dye Other (See Comments)    Gi upset   Latex Rash        Objective:    Physical Exam Constitutional:      General: She is not in acute distress.    Appearance: Normal appearance. She is obese. She is not ill-appearing, toxic-appearing or diaphoretic.  HENT:     Right Ear: Tympanic membrane normal.     Left Ear: Tympanic membrane normal.     Nose: Nose normal. No congestion or rhinorrhea.     Right Turbinates: Not enlarged or swollen.     Left Turbinates: Not enlarged or swollen.     Right Sinus: No maxillary sinus tenderness or frontal sinus tenderness.     Left Sinus: No maxillary sinus tenderness or frontal sinus tenderness.     Mouth/Throat:     Mouth: Mucous membranes are moist.     Pharynx: No pharyngeal swelling, oropharyngeal exudate or posterior oropharyngeal erythema.     Tonsils: No tonsillar exudate.  Eyes:     Extraocular Movements: Extraocular movements intact.     Conjunctiva/sclera: Conjunctivae normal.     Pupils: Pupils are equal, round, and reactive to light.  Neck:     Thyroid: No thyroid mass.  Cardiovascular:      Rate and Rhythm: Normal rate and regular rhythm.  Pulmonary:     Effort: Pulmonary effort is normal.     Breath sounds: Normal breath sounds.  Lymphadenopathy:     Cervical:     Right cervical: No superficial cervical adenopathy.    Left cervical: No superficial cervical adenopathy.  Neurological:     Mental Status: She is alert.       BP 118/78   Pulse 97   Temp 98.6 F (37 C)   Resp 16   Ht 5\' 4"  (1.626 m)   Wt 222 lb 8 oz (100.9 kg)   LMP 10/15/2021   SpO2 98%   BMI 38.19 kg/m  Wt Readings from Last 3 Encounters:  10/19/21 222 lb 8 oz (100.9 kg)  09/18/21 222 lb 8 oz (100.9 kg)  08/16/21 225 lb 2 oz (102.1 kg)     Health Maintenance Due  Topic Date Due   COVID-19 Vaccine (1) Never done   Hepatitis C Screening  Never done    There are no preventive care reminders to display for this patient.  Lab Results  Component Value Date   TSH 1.54 07/12/2021   Lab Results  Component Value Date   WBC 7.9 07/12/2021   HGB 12.3 07/12/2021   HCT 37.3 07/12/2021   MCV 83.5 07/12/2021   PLT 261.0 07/12/2021   Lab Results  Component Value Date   NA 140 07/12/2021   K 4.3 07/12/2021   CO2 28 07/12/2021   GLUCOSE 99 07/12/2021   BUN 16 07/12/2021   CREATININE 0.67 07/12/2021   BILITOT 0.7 07/12/2021   ALKPHOS 64 07/12/2021   AST 18 07/12/2021   ALT 19 07/12/2021   PROT 6.6 07/12/2021   ALBUMIN 4.2 07/12/2021   CALCIUM 9.0 07/12/2021   ANIONGAP 9 06/26/2015   GFR  112.45 07/12/2021   Lab Results  Component Value Date   CHOL 213 (H) 07/12/2021   Lab Results  Component Value Date   HDL 66.70 07/12/2021   Lab Results  Component Value Date   LDLCALC 130 (H) 07/12/2021   Lab Results  Component Value Date   TRIG 80.0 07/12/2021   Lab Results  Component Value Date   CHOLHDL 3 07/12/2021   Lab Results  Component Value Date   HGBA1C 5.5 10/19/2021      Assessment & Plan:   Problem List Items Addressed This Visit       Musculoskeletal and  Integument   Hirsutism    Testosterone normal workup for pcos with transvaginal u/s      Relevant Orders   US PELVIC COMPLETE WITH TRANSVAGINAL     Genitourinary   Menses painful    With irregular cycles Order transvaginal u/s pending results Suspected pcos        Other   Vitamin D deficiency    Continue otc vitamin D      BMI 39.0-39.9,adult   Relevant Orders   POCT glycosylated hemoglobin (Hb A1C) (Completed)   Obesity (BMI 30-39.9)    Will try to start wegovy and get approved now that completion of three months max phentermine completed Pt to continue with diet and exercise as tolerated      Generalized anxiety disorder - Primary   Relevant Medications   busPIRone (BUSPAR) 5 MG tablet   Sore throat    Strep tested in office, negative Warm salt water gargles        Relevant Orders   POCT rapid strep A (Completed)   Irregular menses    Workup for pcos  Pending US transvag      Relevant Orders   US PELVIC COMPLETE WITH TRANSVAGINAL    Meds ordered this encounter  Medications   busPIRone (BUSPAR) 5 MG tablet    Sig: Take 1 tablet (5 mg total) by mouth 3 (three) times daily.    Dispense:  90 tablet    Refill:  2    Order Specific Question:   Supervising Provider    Answer:   Ermalene Searing, AMY E [[id number]]    Follow-up: Return in about 3 months (around 01/19/2022) for regular follow up .    Mort Sawyers, FNP

## 2021-10-19 NOTE — Assessment & Plan Note (Signed)
Workup for pcos  Pending US transvag

## 2021-10-19 NOTE — Assessment & Plan Note (Signed)
Testosterone normal workup for pcos with transvaginal u/s

## 2021-10-19 NOTE — Patient Instructions (Addendum)
Consider xyzal or zyrtec instead of claritin.   Your imaging for ultrasound transvaginal. Please call to schedule.  Has been scheduled at the following location:  [facility] outpatient imaging center off [address], [place] [address] Phone [phone]-  8-5 pm   [address] [place], [place] Phone [phone],  8-430 pm   Due to recent changes in healthcare laws, you may see results of your imaging and/or laboratory studies on MyChart before I have had a chance to review them.  I understand that in some cases there may be results that are confusing or concerning to you. Please understand that not all results are received at the same time and often I may need to interpret multiple results in order to provide you with the best plan of care or course of treatment. Therefore, I ask that you please give me 2 business days to thoroughly review all your results before contacting my office for clarification. Should we see a critical lab result, you will be contacted sooner.   It was a pleasure seeing you today! Please do not hesitate to reach out with any questions and or concerns.  Regards,   Mort Sawyers FNP-C

## 2021-10-19 NOTE — Assessment & Plan Note (Signed)
Will try to start wegovy and get approved now that completion of three months max phentermine completed Pt to continue with diet and exercise as tolerated

## 2021-10-19 NOTE — Assessment & Plan Note (Signed)
With irregular cycles Order transvaginal u/s pending results Suspected pcos

## 2021-10-19 NOTE — Assessment & Plan Note (Signed)
Strep tested in office, negative Warm salt water gargles   

## 2021-10-22 ENCOUNTER — Telehealth: Payer: Self-pay

## 2021-10-22 NOTE — Telephone Encounter (Signed)
Noted  

## 2021-10-22 NOTE — Telephone Encounter (Signed)
Prior auth started for [facility] 0.25MG /0.5ML auto-injectors. Silvestre Moment ([employer]) Rx #: [id number] Waiting for determination.

## 2021-10-25 NOTE — Telephone Encounter (Signed)
We received a denial for prior auth for Wegovy 0.25MG /0.5ML auto-injectors per Cover My Meds.  There is no reason given on Cover My Meds.  It just says:  Your prior authorization request has been denied.  Your request for prior authorization was denied, but an appeal is available for your patient.

## 2021-10-29 ENCOUNTER — Ambulatory Visit
Admission: RE | Admit: 2021-10-29 | Discharge: 2021-10-29 | Disposition: A | Discharge status: Home / Self Care | Payer: BC Managed Care – PPO | Source: Ambulatory Visit | Attending: Family | Admitting: Family

## 2021-10-29 DIAGNOSIS — N926 Irregular menstruation, unspecified: Secondary | ICD-10-CM | POA: Insufficient documentation

## 2021-10-29 DIAGNOSIS — L680 Hirsutism: Secondary | ICD-10-CM | POA: Insufficient documentation

## 2021-10-31 ENCOUNTER — Encounter: Payer: Self-pay | Admitting: Family

## 2021-10-31 DIAGNOSIS — Z6839 Body mass index (BMI) 39.0-39.9, adult: Secondary | ICD-10-CM

## 2021-10-31 DIAGNOSIS — E669 Obesity, unspecified: Secondary | ICD-10-CM

## 2022-01-22 ENCOUNTER — Other Ambulatory Visit: Payer: Self-pay | Admitting: Family

## 2022-01-22 DIAGNOSIS — F411 Generalized anxiety disorder: Secondary | ICD-10-CM

## 2022-01-23 ENCOUNTER — Encounter: Payer: Self-pay | Admitting: Family

## 2022-01-23 NOTE — Telephone Encounter (Signed)
Is patient taking buspirone anymore  Also it looks like she is due for follow-up appointment can we get this scheduled

## 2022-02-04 ENCOUNTER — Encounter: Payer: Self-pay | Admitting: Family

## 2022-02-04 ENCOUNTER — Ambulatory Visit: Payer: BC Managed Care – PPO | Admitting: Family

## 2022-02-04 VITALS — BP 122/68 | HR 85 | Temp 97.9°F | Resp 16 | Ht 64.0 in | Wt 234.1 lb

## 2022-02-04 DIAGNOSIS — F411 Generalized anxiety disorder: Secondary | ICD-10-CM

## 2022-02-04 DIAGNOSIS — B3731 Acute candidiasis of vulva and vagina: Secondary | ICD-10-CM | POA: Diagnosis not present

## 2022-02-04 DIAGNOSIS — J0110 Acute frontal sinusitis, unspecified: Secondary | ICD-10-CM | POA: Insufficient documentation

## 2022-02-04 MED ORDER — DOXYCYCLINE HYCLATE 100 MG PO TABS: 100.0000 mg | ORAL_TABLET | Freq: Two times a day (BID) | ORAL | 0 refills | Status: AC

## 2022-02-04 MED ORDER — BUSPIRONE HCL 5 MG PO TABS: 5.0000 mg | ORAL_TABLET | Freq: Two times a day (BID) | ORAL | 1 refills | Status: DC

## 2022-02-04 MED ORDER — FLUCONAZOLE 150 MG PO TABS: ORAL_TABLET | ORAL | 0 refills | Status: DC

## 2022-02-04 NOTE — Assessment & Plan Note (Addendum)
RX doxycycline 100 mg po bid x 10 days . Pt to continue tylenol/ibuprofen prn sinus pain. Continue with humidifier prn and steam showers recommended as well. instructed If no symptom improvement in 48 hours please f/u  Change to xyzal nightly  Start nasocort daily

## 2022-02-04 NOTE — Progress Notes (Signed)
Established Patient Office Visit  Subjective:  Patient ID: Brenda Gutierrez, female    DOB: January 29, 1986  Age: 36 y.o. MRN: [id number]  CC:  Chief Complaint  Patient presents with   Medication Refill    HPI Brenda Gutierrez is here today for follow up.   Pt is with acute concerns.  Anxiety: buspar 5 mg bid , doing well at this dosage and feels good at this dose.   Allergies: has started claritin in the am and zyrtec nightly which has helped slightly, but ongoing sinus pressure. Not taking flonase, has had bad nose bleeds with this in the past. Does have a lot of post nasal drip. Recently increased sinus pressure and nasal congestion. Frontal pressure.   Past Medical History:  Diagnosis Date   Common migraine with intractable migraine 01/04/2014   Headache    migraines   History of mastitis 03/13/2018   Morbid obesity (HCC)     Past Surgical History:  Procedure Laterality Date   CESAREAN SECTION N/A 07/21/2015   Procedure: CESAREAN SECTION;  Surgeon: Hildred Laser, MD;  Location: [facility] ORS;  Service: Obstetrics;  Laterality: N/A;   CESAREAN SECTION N/A 11/29/2017   Procedure: CESAREAN SECTION;  Surgeon: Tilda Burrow, MD;  Location: [facility] BIRTHING SUITES;  Service: Obstetrics;  Laterality: N/A;   OVARIAN CYST REMOVAL Left    TONSILLECTOMY     TUBAL LIGATION Bilateral 11/29/2017   Procedure: BILATERAL TUBAL LIGATION;  Surgeon: Tilda Burrow, MD;  Location: [facility] BIRTHING SUITES;  Service: Obstetrics;  Laterality: Bilateral;  Filshie Clips   TUMOR REMOVAL  2003   throat-benign   TUMOR REMOVAL  2005   right breast-benign    Family History  Problem Relation Age of Onset   Diabetes Mother    Depression Mother    Anxiety disorder Mother    Depression Father    Hypertension Father    Diabetes Father    Bipolar disorder Father    Hyperlipidemia Father    Diabetes Maternal Grandmother    Melanoma Maternal Grandmother    Diabetes Maternal Grandfather    Hypertension  Paternal Grandmother    Hyperlipidemia Paternal Grandmother    Hypertension Paternal Grandfather    Hyperlipidemia Paternal Grandfather    Breast cancer Paternal Aunt        great aunt, age 78    Social History   Socioeconomic History   Marital status: Married    Spouse name: Brenda Gutierrez   Number of children: 2   Years of education: 16   Highest education level: Bachelor's degree (e.g., BA, AB, BS)  Occupational History   Occupation: fitness instructor  Tobacco Use   Smoking status: Former    Types: Cigarettes    Quit date: 07/26/2007    Years since quitting: 14.5    Passive exposure: Never   Smokeless tobacco: Never  Vaping Use   Vaping Use: Never used  Substance and Sexual Activity   Alcohol use: No    Alcohol/week: 0.0 standard drinks of alcohol    Comment: social   Drug use: No   Sexual activity: Not Currently    Partners: Male    Birth control/protection: None  Other Topics Concern   Not on file  Social History Narrative   37 y/o boy and 36 y/o female   Married   Marketing executive   Son with growth hormone disorder   Social Determinants of Health   Financial Resource Strain: Low Risk  (11/28/2017)   Overall Financial Resource  Strain (CARDIA)    Difficulty of Paying Living Expenses: Not hard at all  Food Insecurity: No Food Insecurity (11/28/2017)   Hunger Vital Sign    Worried About Running Out of Food in the Last Year: Never true    Ran Out of Food in the Last Year: Never true  Transportation Needs: No Transportation Needs (11/28/2017)   PRAPARE - Administrator, Civil Service (Medical): No    Lack of Transportation (Non-Medical): No  Physical Activity: Sufficiently Active (11/28/2017)   Exercise Vital Sign    Days of Exercise per Week: 5 days    Minutes of Exercise per Session: 50 min  Stress: No Stress Concern Present (11/28/2017)   [employer] of Occupational Health - Occupational Stress Questionnaire    Feeling of Stress : Not at all  Social  Connections: Moderately Integrated (11/28/2017)   Social Connection and Isolation Panel [NHANES]    Frequency of Communication with Friends and Family: More than three times a week    Frequency of Social Gatherings with Friends and Family: Once a week    Attends Religious Services: Never    Database administrator or Organizations: Yes    Attends Engineer, structural: More than 4 times per year    Marital Status: Married  Catering manager Violence: Not At Risk (11/28/2017)   Humiliation, Afraid, Rape, and Kick questionnaire    Fear of Current or Ex-Partner: No    Emotionally Abused: No    Physically Abused: No    Sexually Abused: No    Outpatient Medications Prior to Visit  Medication Sig Dispense Refill   Ascorbic Acid (VITAMIN C) 1000 MG tablet Take 1,000 mg by mouth daily.     cetirizine (ZYRTEC) 10 MG tablet Take 10 mg by mouth daily.     Cholecalciferol (VITAMIN D3) 50 MCG (2000 UT) capsule Take 2,000 Units by mouth daily.     cyanocobalamin 1000 MCG tablet Take 1,000 mcg by mouth daily.     Specialty Vitamins Products (BIOTIN PLUS KERATIN) 10000-100 MCG-MG TABS Take by mouth.     busPIRone (BUSPAR) 5 MG tablet TAKE ONE TABLET BY MOUTH TWICE DAILY 60 tablet 2   Semaglutide-Weight Management (WEGOVY) 0.25 MG/0.5ML SOAJ Inject 0.25 mg qweek [place] for 4 weeks then increase to 0.5 mg qweek [place] for four more weeks (Patient not taking: Reported on 02/04/2022) 3 mL 0   No facility-administered medications prior to visit.    Allergies  Allergen Reactions   Bee Pollen Anaphylaxis   Armed forces technical officer Agent] Anaphylaxis   Insect Extract Anaphylaxis   Penicillin G Anaphylaxis   Penicillins Anaphylaxis    Has patient had a PCN reaction causing immediate rash, facial/tongue/throat swelling, SOB or lightheadedness with hypotension: Yes Has patient had a PCN reaction causing severe rash involving mucus membranes or skin necrosis: Yes Has patient had a PCN reaction that required  hospitalization: No Has patient had a PCN reaction occurring within the last 10 years: Yes If all of the above answers are "NO", then may proceed with Cephalosporin use.    Adhesive [Tape]    Amoxicillin-Pot Clavulanate Hives   Red Dye Other (See Comments)    Gi upset   Latex Rash          Objective:    Physical Exam Constitutional:      General: She is not in acute distress.    Appearance: Normal appearance. She is obese. She is not ill-appearing.  HENT:  Right Ear: A middle ear effusion (clear) is present.     Left Ear: Tympanic membrane is retracted.     Nose: Congestion present. No rhinorrhea.     Right Turbinates: Swollen. Not enlarged.     Left Turbinates: Not enlarged or swollen.     Right Sinus: Maxillary sinus tenderness and frontal sinus tenderness present.     Left Sinus: Maxillary sinus tenderness and frontal sinus tenderness present.     Mouth/Throat:     Mouth: Mucous membranes are moist.     Pharynx: Posterior oropharyngeal erythema present. No pharyngeal swelling or oropharyngeal exudate.     Tonsils: No tonsillar exudate.  Eyes:     Extraocular Movements: Extraocular movements intact.     Conjunctiva/sclera: Conjunctivae normal.     Pupils: Pupils are equal, round, and reactive to light.  Neck:     Thyroid: No thyroid mass.  Cardiovascular:     Rate and Rhythm: Normal rate and regular rhythm.  Pulmonary:     Effort: Pulmonary effort is normal.     Breath sounds: Normal breath sounds.  Chest:     Chest wall: No tenderness.  Lymphadenopathy:     Cervical:     Right cervical: No superficial cervical adenopathy.    Left cervical: No superficial cervical adenopathy.  Neurological:     General: No focal deficit present.     Mental Status: She is alert and oriented to person, place, and time. Mental status is at baseline.  Psychiatric:        Mood and Affect: Mood normal.        Behavior: Behavior normal.        Thought Content: Thought content  normal.        Judgment: Judgment normal.      BP 122/68   Pulse 85   Temp 97.9 F (36.6 C)   Resp 16   Ht 5\' 4"  (1.626 m)   Wt 234 lb 2 oz (106.2 kg)   LMP 01/23/2022 (Approximate)   SpO2 98%   BMI 40.19 kg/m  Wt Readings from Last 3 Encounters:  02/04/22 234 lb 2 oz (106.2 kg)  10/19/21 222 lb 8 oz (100.9 kg)  09/18/21 222 lb 8 oz (100.9 kg)     Health Maintenance Due  Topic Date Due   COVID-19 Vaccine (1) Never done   Hepatitis C Screening  Never done   INFLUENZA VACCINE  11/20/2021    There are no preventive care reminders to display for this patient.  Lab Results  Component Value Date   TSH 1.54 07/12/2021   Lab Results  Component Value Date   WBC 7.9 07/12/2021   HGB 12.3 07/12/2021   HCT 37.3 07/12/2021   MCV 83.5 07/12/2021   PLT 261.0 07/12/2021   Lab Results  Component Value Date   NA 140 07/12/2021   K 4.3 07/12/2021   CO2 28 07/12/2021   GLUCOSE 99 07/12/2021   BUN 16 07/12/2021   CREATININE 0.67 07/12/2021   BILITOT 0.7 07/12/2021   ALKPHOS 64 07/12/2021   AST 18 07/12/2021   ALT 19 07/12/2021   PROT 6.6 07/12/2021   ALBUMIN 4.2 07/12/2021   CALCIUM 9.0 07/12/2021   ANIONGAP 9 06/26/2015   GFR 112.45 07/12/2021   Lab Results  Component Value Date   CHOL 213 (H) 07/12/2021   Lab Results  Component Value Date   HDL 66.70 07/12/2021   Lab Results  Component Value Date   LDLCALC 130 (H) 07/12/2021  Lab Results  Component Value Date   TRIG 80.0 07/12/2021   Lab Results  Component Value Date   CHOLHDL 3 07/12/2021   Lab Results  Component Value Date   HGBA1C 5.5 10/19/2021      Assessment & Plan:   Problem List Items Addressed This Visit       Respiratory   Acute non-recurrent frontal sinusitis    RX doxycycline 100 mg po bid x 10 days . Pt to continue tylenol/ibuprofen prn sinus pain. Continue with humidifier prn and steam showers recommended as well. instructed If no symptom improvement in 48 hours please  f/u  Change to xyzal nightly  Start nasocort daily        Relevant Medications   fluconazole (DIFLUCAN) 150 MG tablet   doxycycline (VIBRA-TABS) 100 MG tablet     Other   Generalized anxiety disorder    Continue buspar 5 mg twice daily  Refill given      Relevant Medications   busPIRone (BUSPAR) 5 MG tablet   Other Visit Diagnoses     Vaginal candida    -  Primary   Relevant Medications   fluconazole (DIFLUCAN) 150 MG tablet       Meds ordered this encounter  Medications   busPIRone (BUSPAR) 5 MG tablet    Sig: Take 1 tablet (5 mg total) by mouth 2 (two) times daily.    Dispense:  180 tablet    Refill:  1    NEED REFILL   fluconazole (DIFLUCAN) 150 MG tablet    Sig: Take one po qd for one dose, repeat in three days if still with symptoms.    Dispense:  2 tablet    Refill:  0    Order Specific Question:   Supervising Provider    Answer:   BEDSOLE, AMY E [[id number]]   doxycycline (VIBRA-TABS) 100 MG tablet    Sig: Take 1 tablet (100 mg total) by mouth 2 (two) times daily for 10 days.    Dispense:  20 tablet    Refill:  0    Order Specific Question:   Supervising Provider    Answer:   BEDSOLE, AMY E [[id number]]    Follow-up: No follow-ups on file.    Mort Sawyers, FNP

## 2022-02-04 NOTE — Patient Instructions (Signed)
Consider nasocort and or flonase with the saline spray in it daily  Consider changing to xyzal at night time.   Start RX doxycycline 100 mg po bid x 10 days.  Tylenol/ibuprofen ok for sinus pain as needed Increase oral fluids. Ok to continue with humidifers and hot steamy showers as discussed during visit.  It was a pleasure speaking with you today, I hope you start feeling better soon.  Regards,   Eugenia Pancoast

## 2022-02-04 NOTE — Assessment & Plan Note (Signed)
Continue buspar 5 mg twice daily  Refill given

## 2022-02-26 ENCOUNTER — Encounter: Payer: Self-pay | Admitting: Family

## 2022-02-26 DIAGNOSIS — J0110 Acute frontal sinusitis, unspecified: Secondary | ICD-10-CM

## 2022-02-26 DIAGNOSIS — B3731 Acute candidiasis of vulva and vagina: Secondary | ICD-10-CM

## 2022-02-28 MED ORDER — FLUCONAZOLE 150 MG PO TABS: 150.0000 mg | ORAL_TABLET | Freq: Once | ORAL | 0 refills | Status: AC

## 2022-02-28 MED ORDER — AZITHROMYCIN 250 MG PO TABS: ORAL_TABLET | ORAL | 0 refills | Status: AC

## 2022-04-01 ENCOUNTER — Ambulatory Visit: Payer: BC Managed Care – PPO | Admitting: Nurse Practitioner

## 2022-04-01 ENCOUNTER — Encounter: Payer: Self-pay | Admitting: Nurse Practitioner

## 2022-04-01 VITALS — BP 98/62 | HR 87 | Temp 98.0°F | Resp 16 | Ht 64.0 in | Wt 235.2 lb

## 2022-04-01 DIAGNOSIS — J069 Acute upper respiratory infection, unspecified: Secondary | ICD-10-CM

## 2022-04-01 DIAGNOSIS — H6993 Unspecified Eustachian tube disorder, bilateral: Secondary | ICD-10-CM | POA: Diagnosis not present

## 2022-04-01 DIAGNOSIS — J3489 Other specified disorders of nose and nasal sinuses: Secondary | ICD-10-CM

## 2022-04-01 MED ORDER — PREDNISONE 20 MG PO TABS: ORAL_TABLET | ORAL | 0 refills | Status: AC

## 2022-04-01 NOTE — Progress Notes (Signed)
Acute Office Visit  Subjective:     Patient ID: Brenda Gutierrez, female    DOB: 09-09-1985, 36 y.o.   MRN: [id number]  Chief Complaint  Patient presents with   Sinusitis    X Friday Covid Neg   Ear Pain     Patient is in today for Sick sympotms  Symptoms started on Friday this time  States that her whole family had strep in September. She has had two sinus and then one ear infection. Was establishe with ENt in the past  States that she did test Friday and last night and both negative  Flu and covid vaccines up to date States that her kids had a runny nose last week but have since recovered   States that she has tried tylenol, mucinex not great releif   Review of Systems  Constitutional:  Positive for fever and malaise/fatigue. Negative for chills.  HENT:  Positive for ear pain (left ear pain, right feels full), sinus pain and sore throat (from drainage).   Respiratory:  Positive for cough. Negative for shortness of breath.   Cardiovascular:  Negative for chest pain.  Musculoskeletal:  Positive for joint pain and myalgias.  Neurological:  Positive for headaches.        Objective:    BP 98/62   Pulse 87   Temp 98 F (36.7 C)   Resp 16   Ht 5\' 4"  (1.626 m)   Wt 235 lb 4 oz (106.7 kg)   LMP 03/16/2022   SpO2 98%   BMI 40.38 kg/m    Physical Exam Vitals and nursing note reviewed.  Constitutional:      Appearance: Normal appearance. She is obese.  HENT:     Right Ear: Tympanic membrane, ear canal and external ear normal.     Left Ear: Tympanic membrane, ear canal and external ear normal.     Nose:     Right Sinus: No maxillary sinus tenderness or frontal sinus tenderness.     Left Sinus: No maxillary sinus tenderness or frontal sinus tenderness.     Mouth/Throat:     Mouth: Mucous membranes are moist.     Pharynx: Oropharynx is clear.  Cardiovascular:     Rate and Rhythm: Normal rate and regular rhythm.     Heart sounds: Normal heart sounds.   Pulmonary:     Effort: Pulmonary effort is normal.     Breath sounds: Normal breath sounds.  Lymphadenopathy:     Cervical: No cervical adenopathy.  Neurological:     Mental Status: She is alert.     No results found for any visits on 04/01/22.      Assessment & Plan:   Problem List Items Addressed This Visit       Respiratory   Upper respiratory tract infection - Primary    Likely viral in nature.  Did give signs and symptoms when to reach out to clinic or to be reevaluated.  She can MyChart me on Friday she got improvement.  Did inform her this can get worse before gets better.  Continue over-the-counter medications as needed.  Rest push fluids        Nervous and Auditory   Eustachian tube dysfunction, bilateral    Continue using second-generation antihistamine, Flonase, and will prescribe a course of prednisone.  Prednisone precautions reviewed with patient      Relevant Medications   predniSONE (DELTASONE) 20 MG tablet     Other   Sinus pressure  Continue over-the-counter medications such as second-generation histamine and Flonase.  Will do a short course of prednisone, precautions reviewed      Relevant Medications   predniSONE (DELTASONE) 20 MG tablet    Meds ordered this encounter  Medications   predniSONE (DELTASONE) 20 MG tablet    Sig: Take 1 tablet (20 mg total) by mouth 2 (two) times daily with a meal for 3 days, THEN 1 tablet (20 mg total) daily with breakfast for 3 days. Avoid NSAIDs like: ibuprofen, mortin, aleve, naproxen, BC/Goody powders.    Dispense:  9 tablet    Refill:  0    Order Specific Question:   Supervising Provider    Answer:   TOWER, MARNE A [[id number]    Return if symptoms worsen or fail to improve.  Audria Nine, NP

## 2022-04-01 NOTE — Assessment & Plan Note (Signed)
Likely viral in nature.  Did give signs and symptoms when to reach out to clinic or to be reevaluated.  She can MyChart me on Friday she got improvement.  Did inform her this can get worse before gets better.  Continue over-the-counter medications as needed.  Rest push fluids

## 2022-04-01 NOTE — Assessment & Plan Note (Signed)
Continue using second-generation antihistamine, Flonase, and will prescribe a course of prednisone.  Prednisone precautions reviewed with patient

## 2022-04-01 NOTE — Patient Instructions (Signed)
Nice to see you today Continue using the flonase and Xyzal. Rest what you can, push fluids Follow up if no improvement by Friday, sooner if you need me You can message me on mychart

## 2022-04-01 NOTE — Assessment & Plan Note (Signed)
Continue over-the-counter medications such as second-generation histamine and Flonase.  Will do a short course of prednisone, precautions reviewed

## 2022-11-27 DIAGNOSIS — M722 Plantar fascial fibromatosis: Secondary | ICD-10-CM | POA: Diagnosis not present

## 2022-11-27 DIAGNOSIS — M7661 Achilles tendinitis, right leg: Secondary | ICD-10-CM | POA: Diagnosis not present

## 2022-12-06 ENCOUNTER — Encounter: Payer: Self-pay | Admitting: Family Medicine

## 2022-12-06 ENCOUNTER — Ambulatory Visit: Payer: BC Managed Care – PPO | Admitting: Family Medicine

## 2022-12-06 VITALS — BP 102/68 | HR 81 | Temp 98.0°F | Ht 64.0 in | Wt 248.1 lb

## 2022-12-06 DIAGNOSIS — R3915 Urgency of urination: Secondary | ICD-10-CM

## 2022-12-06 DIAGNOSIS — N898 Other specified noninflammatory disorders of vagina: Secondary | ICD-10-CM

## 2022-12-06 LAB — POC URINALSYSI DIPSTICK (AUTOMATED)
Bilirubin, UA: NEGATIVE
Glucose, UA: NEGATIVE
Ketones, UA: NEGATIVE
Leukocytes, UA: NEGATIVE
Nitrite, UA: NEGATIVE
Protein, UA: NEGATIVE
Spec Grav, UA: 1.02 (ref 1.010–1.025)
Urobilinogen, UA: 0.2 E.U./dL
pH, UA: 6 (ref 5.0–8.0)

## 2022-12-06 NOTE — Progress Notes (Signed)
Patient ID: Brenda Gutierrez, female    DOB: May 14, 1985, 37 y.o.   MRN: [id number]  This visit was conducted in person.  BP 102/68 (BP Location: Left Arm, Patient Position: Sitting, Cuff Size: Large)   Pulse 81   Temp 98 F (36.7 C) (Temporal)   Ht 5\' 4"  (1.626 m)   Wt 248 lb 2 oz (112.5 kg)   LMP 11/29/2022   SpO2 98%   BMI 42.59 kg/m    CC:  Chief Complaint  Patient presents with   Urinary Urgency    Subjective:   HPI: Brenda Gutierrez is a 37 y.o. female presenting on 12/06/2022 for Urinary Urgency  Urinary Tract Infection  This is a new problem. The current episode started in the past 7 days. The problem has been unchanged. Quality: pressure tingling feeling like she has to constantly urinate. The patient is experiencing no pain. There has been no fever. She is Sexually active. Associated symptoms include urgency. Pertinent negatives include no discharge, flank pain, frequency, hematuria, nausea or vomiting. Associated symptoms comments:  Started menses  in last few days as well.  Ligth menses now. . She has tried increased fluids for the symptoms. The treatment provided no relief. There is no history of catheterization, kidney stones, recurrent UTIs, a single kidney, urinary stasis or a urological procedure.    She has been using meloxicam since last well.  Has been taking ibuprofen for a month.     Relevant past medical, surgical, family and social history reviewed and updated as indicated. Interim medical history since our last visit reviewed. Allergies and medications reviewed and updated. Outpatient Medications Prior to Visit  Medication Sig Dispense Refill   Ascorbic Acid (VITAMIN C) 1000 MG tablet Take 1,000 mg by mouth daily.     levocetirizine (XYZAL) 5 MG tablet Take 5 mg by mouth every evening.     Nutritional Supplements (NUTRITIONAL SUPPLEMENT PO) Take by mouth. MYO-INOSITOL 12 drop by mouth daily     Specialty Vitamins Products (BIOTIN PLUS KERATIN)  10000-100 MCG-MG TABS Take by mouth.     busPIRone (BUSPAR) 5 MG tablet Take 1 tablet (5 mg total) by mouth 2 (two) times daily. 180 tablet 1   cetirizine (ZYRTEC) 10 MG tablet Take 10 mg by mouth daily.     Cholecalciferol (VITAMIN D3) 50 MCG (2000 UT) capsule Take 2,000 Units by mouth daily.     cyanocobalamin 1000 MCG tablet Take 1,000 mcg by mouth daily.     No facility-administered medications prior to visit.     Per HPI unless specifically indicated in ROS section below Review of Systems  Constitutional:  Negative for fatigue and fever.  HENT:  Negative for congestion.   Eyes:  Negative for pain.  Respiratory:  Negative for cough and shortness of breath.   Cardiovascular:  Negative for chest pain, palpitations and leg swelling.  Gastrointestinal:  Negative for abdominal pain, nausea and vomiting.  Genitourinary:  Positive for urgency. Negative for dysuria, flank pain, frequency, hematuria and vaginal bleeding.  Musculoskeletal:  Negative for back pain.  Neurological:  Negative for syncope, light-headedness and headaches.  Psychiatric/Behavioral:  Negative for dysphoric mood.    Objective:  BP 102/68 (BP Location: Left Arm, Patient Position: Sitting, Cuff Size: Large)   Pulse 81   Temp 98 F (36.7 C) (Temporal)   Ht 5\' 4"  (1.626 m)   Wt 248 lb 2 oz (112.5 kg)   LMP 11/29/2022   SpO2 98%   BMI  42.59 kg/m   Wt Readings from Last 3 Encounters:  12/06/22 248 lb 2 oz (112.5 kg)  04/01/22 235 lb 4 oz (106.7 kg)  02/04/22 234 lb 2 oz (106.2 kg)      Physical Exam Constitutional:      General: She is not in acute distress.    Appearance: Normal appearance. She is well-developed. She is not ill-appearing or toxic-appearing.  HENT:     Head: Normocephalic.     Right Ear: Hearing, tympanic membrane, ear canal and external ear normal. Tympanic membrane is not erythematous, retracted or bulging.     Left Ear: Hearing, tympanic membrane, ear canal and external ear normal.  Tympanic membrane is not erythematous, retracted or bulging.     Nose: No mucosal edema or rhinorrhea.     Right Sinus: No maxillary sinus tenderness or frontal sinus tenderness.     Left Sinus: No maxillary sinus tenderness or frontal sinus tenderness.     Mouth/Throat:     Mouth: Oropharynx is clear and moist and mucous membranes are normal.     Pharynx: Uvula midline.  Eyes:     General: Lids are normal. Lids are everted, no foreign bodies appreciated.     Extraocular Movements: EOM normal.     Conjunctiva/sclera: Conjunctivae normal.     Pupils: Pupils are equal, round, and reactive to light.  Neck:     Thyroid: No thyroid mass or thyromegaly.     Vascular: No carotid bruit.     Trachea: Trachea normal.  Cardiovascular:     Rate and Rhythm: Normal rate and regular rhythm.     Pulses: Normal pulses.     Heart sounds: Normal heart sounds, S1 normal and S2 normal. No murmur heard.    No friction rub. No gallop.  Pulmonary:     Effort: Pulmonary effort is normal. No tachypnea or respiratory distress.     Breath sounds: Normal breath sounds. No decreased breath sounds, wheezing, rhonchi or rales.  Abdominal:     General: Bowel sounds are normal.     Palpations: Abdomen is soft.     Tenderness: There is no abdominal tenderness.  Musculoskeletal:     Cervical back: Normal range of motion and neck supple.  Skin:    General: Skin is warm, dry and intact.     Findings: No rash.  Neurological:     Mental Status: She is alert.  Psychiatric:        Mood and Affect: Mood is not anxious or depressed.        Speech: Speech normal.        Behavior: Behavior normal. Behavior is cooperative.        Thought Content: Thought content normal.        Cognition and Memory: Cognition and memory normal.        Judgment: Judgment normal.       Results for orders placed or performed in visit on 12/06/22  WET PREP BY MOLECULAR PROBE   Specimen: Vaginal Swab  Result Value Ref Range   MICRO  NUMBER: [id number]    SPECIMEN QUALITY: Adequate    SOURCE: VAGINA    STATUS: FINAL    Trichomonas vaginosis Not Detected    Gardnerella vaginalis Not Detected    Candida species Not Detected   Urine Culture   Specimen: Vaginal Swab  Result Value Ref Range   MICRO NUMBER: [id number]    SPECIMEN QUALITY: Adequate    Sample Source URINE  STATUS: FINAL    ISOLATE 1: Escherichia coli (A)       Susceptibility   Escherichia coli - URINE CULTURE, REFLEX    AMOX/CLAVULANIC <=2 Sensitive     AMPICILLIN 4 Sensitive     AMPICILLIN/SULBACTAM <=2 Sensitive     CEFAZOLIN* <=4 Not Reportable      * For infections other than uncomplicated UTI caused by E. coli, K. pneumoniae or P. mirabilis: Cefazolin is resistant if MIC > or = 8 mcg/mL. (Distinguishing susceptible versus intermediate for isolates with MIC < or = 4 mcg/mL requires additional testing.) For uncomplicated UTI caused by E. coli, K. pneumoniae or P. mirabilis: Cefazolin is susceptible if MIC <32 mcg/mL and predicts susceptible to the oral agents cefaclor, cefdinir, cefpodoxime, cefprozil, cefuroxime, cephalexin and loracarbef.     CEFTAZIDIME <=1 Sensitive     CEFEPIME <=1 Sensitive     CEFTRIAXONE <=1 Sensitive     CIPROFLOXACIN <=0.25 Sensitive     LEVOFLOXACIN <=0.12 Sensitive     GENTAMICIN <=1 Sensitive     IMIPENEM <=0.25 Sensitive     NITROFURANTOIN <=16 Sensitive     PIP/TAZO <=4 Sensitive     TOBRAMYCIN <=1 Sensitive     TRIMETH/SULFA* <=20 Sensitive      * For infections other than uncomplicated UTI caused by E. coli, K. pneumoniae or P. mirabilis: Cefazolin is resistant if MIC > or = 8 mcg/mL. (Distinguishing susceptible versus intermediate for isolates with MIC < or = 4 mcg/mL requires additional testing.) For uncomplicated UTI caused by E. coli, K. pneumoniae or P. mirabilis: Cefazolin is susceptible if MIC <32 mcg/mL and predicts susceptible to the oral agents cefaclor, cefdinir, cefpodoxime, cefprozil,  cefuroxime, cephalexin and loracarbef. Legend: S = Susceptible  I = Intermediate R = Resistant  NS = Not susceptible * = Not tested  NR = Not reported **NN = See antimicrobic comments   POCT Urinalysis Dipstick (Automated)  Result Value Ref Range   Color, UA Yellow    Clarity, UA Clear    Glucose, UA Negative Negative   Bilirubin, UA Negative    Ketones, UA Negative    Spec Grav, UA 1.020 1.010 - 1.025   Blood, UA Moderate (2+)    pH, UA 6.0 5.0 - 8.0   Protein, UA Negative Negative   Urobilinogen, UA 0.2 0.2 or 1.0 E.U./dL   Nitrite, UA Negative    Leukocytes, UA Negative Negative    Assessment and Plan  Urinary urgency -     POCT Urinalysis Dipstick (Automated) -     Urine Culture  Vaginal irritation -     WET PREP BY MOLECULAR PROBE  Urinalysis negative suggesting against urinary tract infection.  Possible bladder irritation versus vaginal infection. Push water intake.  Consider holding meloxicam if symptoms are increasing.  Avoid bladder irritants.   We will call with wet prep results and urine culture results in early next week.  No follow-ups on file.   Kerby Nora, MD

## 2022-12-06 NOTE — Patient Instructions (Signed)
Push water intake.  Consider holding meloxicam if symptoms are increasing.  Avoid bladder irritants.   We will call with wet prep results and urine culture results in early next week.

## 2022-12-10 ENCOUNTER — Other Ambulatory Visit: Payer: Self-pay | Admitting: Family Medicine

## 2022-12-10 LAB — URINE CULTURE
MICRO NUMBER:: 15342038
SPECIMEN QUALITY:: ADEQUATE

## 2022-12-10 LAB — WET PREP BY MOLECULAR PROBE
Candida species: NOT DETECTED
Gardnerella vaginalis: NOT DETECTED
MICRO NUMBER:: 15342037
SPECIMEN QUALITY:: ADEQUATE
Trichomonas vaginosis: NOT DETECTED

## 2022-12-10 MED ORDER — SULFAMETHOXAZOLE-TRIMETHOPRIM 800-160 MG PO TABS: 1.0000 | ORAL_TABLET | Freq: Two times a day (BID) | ORAL | 0 refills | Status: DC

## 2022-12-27 DIAGNOSIS — N898 Other specified noninflammatory disorders of vagina: Secondary | ICD-10-CM | POA: Insufficient documentation

## 2022-12-27 DIAGNOSIS — R3915 Urgency of urination: Secondary | ICD-10-CM | POA: Insufficient documentation

## 2023-03-04 DIAGNOSIS — M7662 Achilles tendinitis, left leg: Secondary | ICD-10-CM | POA: Diagnosis not present

## 2023-03-04 DIAGNOSIS — M722 Plantar fascial fibromatosis: Secondary | ICD-10-CM | POA: Diagnosis not present

## 2023-03-17 DIAGNOSIS — M7662 Achilles tendinitis, left leg: Secondary | ICD-10-CM | POA: Diagnosis not present

## 2023-05-15 ENCOUNTER — Other Ambulatory Visit: Payer: Self-pay

## 2023-05-15 DIAGNOSIS — F900 Attention-deficit hyperactivity disorder, predominantly inattentive type: Secondary | ICD-10-CM | POA: Diagnosis not present

## 2023-05-15 DIAGNOSIS — F411 Generalized anxiety disorder: Secondary | ICD-10-CM | POA: Diagnosis not present

## 2023-05-15 MED ORDER — LISDEXAMFETAMINE DIMESYLATE 30 MG PO CAPS
30.0000 mg | ORAL_CAPSULE | Freq: Every day | ORAL | 0 refills | Status: DC
  Filled 2023-05-15: qty 30, 30d supply, fill #0

## 2023-05-16 ENCOUNTER — Other Ambulatory Visit: Payer: Self-pay

## 2023-05-16 ENCOUNTER — Ambulatory Visit: Payer: BC Managed Care – PPO | Admitting: Family Medicine

## 2023-05-16 VITALS — BP 130/87 | HR 72 | Wt 249.6 lb

## 2023-05-16 DIAGNOSIS — N907 Vulvar cyst: Secondary | ICD-10-CM | POA: Diagnosis not present

## 2023-05-16 MED ORDER — AMPHETAMINE-DEXTROAMPHET ER 10 MG PO CP24
10.0000 mg | ORAL_CAPSULE | Freq: Every day | ORAL | 0 refills | Status: DC
  Filled 2023-05-16: qty 30, 30d supply, fill #0

## 2023-05-16 NOTE — Progress Notes (Unsigned)
CC: Cyst ruptured on labia, left side

## 2023-05-21 NOTE — Progress Notes (Signed)
   GYNECOLOGY PROBLEM  VISIT ENCOUNTER NOTE  Subjective:   Brenda Gutierrez is a 38 y.o. G21P2002 female here for a problem GYN visit.  Current complaints: cyst on left labia. Reports this is recurrent and has opened and was draining blood fluid. Reports it was painful more so prior to draining. Has had this happen 1-2 times prior. Using warm compresses on the area.   Called clinic today and was added on same day.   Denies abnormal vaginal bleeding, discharge, pelvic pain, problems with intercourse or other gynecologic concerns.    Gynecologic History No LMP recorded.  Contraception: none  Health Maintenance Due  Topic Date Due   Hepatitis C Screening  Never done   COVID-19 Vaccine (1 - 2024-25 season) Never done    The following portions of the patient's history were reviewed and updated as appropriate: allergies, current medications, past family history, past medical history, past social history, past surgical history and problem list.  Review of Systems Pertinent items are noted in HPI.   Objective:  BP 130/87   Pulse 72   Wt 249 lb 9.6 oz (113.2 kg)   BMI 42.84 kg/m  Gen: well appearing, NAD HEENT: no scleral icterus CV: RR Lung: Normal WOB Ext: warm well perfused  PELVIC: Normal appearing external genitalia; normal appearing vaginal mucosa and cervix.  Small < 0.5cm erythematous nodule on the left superior labia, TTP. There was a naturally draining area. No abnormal discharge noted.      Identified area of concern located at superior left labia minora . 0.5 cm fluctuant area with surrounding erythema consistent with abscess.    Offered in office I&D drainage   Verbal and written consent obtained.    Area was infiltrated with 1mL of 2% lidocaine with epinephrine in the ringe block fashion around the fluctuant area.  Using 11 blade, 0.5 cm stab incision was made with immediate drainage of purulent fluid and blood. Used pressure to drain as much as possible, utilized  hemostats to break up any bands, unable to locate cyst wall.  Used silver nitrate for hemostasis.   Patient tolerated procedure  Assessment and Plan:   1. Labial cyst (Primary) - drained/opened - unable to remove cyst wall - no cellulitis or need for abx - Monitor area.    Please refer to After Visit Summary for other counseling recommendations.   Return if symptoms worsen or fail to improve.  Federico Flake, MD, MPH, ABFM Attending Physician Faculty Practice- [facility]

## 2023-05-26 ENCOUNTER — Ambulatory Visit: Payer: BC Managed Care – PPO | Admitting: Obstetrics and Gynecology

## 2023-06-03 ENCOUNTER — Other Ambulatory Visit: Payer: Self-pay

## 2023-06-03 DIAGNOSIS — F411 Generalized anxiety disorder: Secondary | ICD-10-CM | POA: Diagnosis not present

## 2023-06-03 DIAGNOSIS — F900 Attention-deficit hyperactivity disorder, predominantly inattentive type: Secondary | ICD-10-CM | POA: Diagnosis not present

## 2023-06-03 MED ORDER — AMPHETAMINE-DEXTROAMPHET ER 10 MG PO CP24
10.0000 mg | ORAL_CAPSULE | Freq: Every day | ORAL | 0 refills | Status: DC
  Filled 2023-06-16: qty 30, 30d supply, fill #0

## 2023-06-15 ENCOUNTER — Encounter: Payer: Self-pay | Admitting: Emergency Medicine

## 2023-06-15 ENCOUNTER — Ambulatory Visit (INDEPENDENT_AMBULATORY_CARE_PROVIDER_SITE_OTHER): Payer: BC Managed Care – PPO

## 2023-06-15 ENCOUNTER — Ambulatory Visit
Admission: EM | Admit: 2023-06-15 | Discharge: 2023-06-15 | Disposition: A | Discharge status: Home / Self Care | Payer: BC Managed Care – PPO | Attending: Emergency Medicine | Admitting: Emergency Medicine

## 2023-06-15 DIAGNOSIS — J09X2 Influenza due to identified novel influenza A virus with other respiratory manifestations: Secondary | ICD-10-CM | POA: Insufficient documentation

## 2023-06-15 DIAGNOSIS — R059 Cough, unspecified: Secondary | ICD-10-CM | POA: Diagnosis not present

## 2023-06-15 DIAGNOSIS — R062 Wheezing: Secondary | ICD-10-CM | POA: Diagnosis not present

## 2023-06-15 DIAGNOSIS — R051 Acute cough: Secondary | ICD-10-CM | POA: Insufficient documentation

## 2023-06-15 DIAGNOSIS — R509 Fever, unspecified: Secondary | ICD-10-CM | POA: Diagnosis not present

## 2023-06-15 LAB — RESP PANEL BY RT-PCR (FLU A&B, COVID) ARPGX2
Influenza A by PCR: POSITIVE — AB
Influenza B by PCR: NEGATIVE
SARS Coronavirus 2 by RT PCR: NEGATIVE

## 2023-06-15 MED ORDER — PROMETHAZINE-DM 6.25-15 MG/5ML PO SYRP: 5.0000 mL | ORAL_SOLUTION | Freq: Four times a day (QID) | ORAL | 0 refills | Status: DC | PRN

## 2023-06-15 MED ORDER — AEROCHAMBER MV MISC: 2 refills | Status: DC

## 2023-06-15 MED ORDER — BENZONATATE 100 MG PO CAPS: 200.0000 mg | ORAL_CAPSULE | Freq: Three times a day (TID) | ORAL | 0 refills | Status: DC

## 2023-06-15 MED ORDER — ALBUTEROL SULFATE HFA 108 (90 BASE) MCG/ACT IN AERS: 2.0000 | INHALATION_SPRAY | RESPIRATORY_TRACT | 0 refills | Status: DC | PRN

## 2023-06-15 MED ORDER — IPRATROPIUM BROMIDE 0.06 % NA SOLN: 2.0000 | Freq: Four times a day (QID) | NASAL | 12 refills | Status: DC

## 2023-06-15 NOTE — ED Provider Notes (Signed)
 [facility]    CSN: [id number] Arrival date & time: 06/15/23  1145      History   Chief Complaint Chief Complaint  Patient presents with   Cough   Generalized Body Aches   Fever    HPI Brenda Gutierrez is a 38 y.o. female.   HPI  38 year old female with past medical history significant for morbid obesity, mastitis, migraine headaches, generalized anxiety disorder, vitamin D deficiency, alopecia, and hirsutism presents for evaluation of flulike symptoms that started 4 days ago.  She reports that her son was diagnosed with influenza 6 days ago.  She reports that she has been running fevers at home with a Tmax of 104.  She also Dors is runny nose, nasal congestion, hoarseness, body aches, chest tightness, shortness breath, and wheezing.  Past Medical History:  Diagnosis Date   Common migraine with intractable migraine 01/04/2014   Headache    migraines   History of mastitis 03/13/2018   Morbid obesity [facility])     Patient Active Problem List   Diagnosis Date Noted   Urinary urgency 12/27/2022   Vaginal irritation 12/27/2022   Upper respiratory tract infection 04/01/2022   Eustachian tube dysfunction, bilateral 04/01/2022   Sinus pressure 04/01/2022   Acute non-recurrent frontal sinusitis 02/04/2022   Menses painful 10/19/2021   Irregular menses 10/19/2021   Obesity (BMI 30-39.9) 08/17/2021   Generalized anxiety disorder 08/17/2021   Goiter 07/15/2021   Hirsutism 07/15/2021   Acne vulgaris 07/15/2021   Alopecia 07/12/2021   Absolute anemia 07/12/2021   Family history of diabetes mellitus in mother 07/12/2021   Vitamin D deficiency 07/12/2021   BMI 39.0-39.9,adult 07/12/2021   Anxiety 02/23/2018    Past Surgical History:  Procedure Laterality Date   CESAREAN SECTION N/A 07/21/2015   Procedure: CESAREAN SECTION;  Surgeon: Hildred Laser, MD;  Location: [facility] ORS;  Service: Obstetrics;  Laterality: N/A;   CESAREAN SECTION N/A 11/29/2017   Procedure:  CESAREAN SECTION;  Surgeon: Tilda Burrow, MD;  Location: [facility] BIRTHING SUITES;  Service: Obstetrics;  Laterality: N/A;   OVARIAN CYST REMOVAL Left    TONSILLECTOMY     TUBAL LIGATION Bilateral 11/29/2017   Procedure: BILATERAL TUBAL LIGATION;  Surgeon: Tilda Burrow, MD;  Location: [facility]IRTHING SUITES;  Service: Obstetrics;  Laterality: Bilateral;  Filshie Clips   TUMOR REMOVAL  2003   throat-benign   TUMOR REMOVAL  2005   right breast-benign    OB History     Gravida  2   Para  2   Term  2   Preterm  0   AB  0   Living  2      SAB  0   IAB  0   Ectopic  0   Multiple  0   Live Births  2        Obstetric Comments  Cesarean section was a low transverse incision, not a low vertical (this is an error)          Home Medications    Prior to Admission medications   Medication Sig Start Date End Date Taking? Authorizing Provider  albuterol (VENTOLIN HFA) 108 (90 Base) MCG/ACT inhaler Inhale 2 puffs into the lungs every 4 (four) hours as needed. 06/15/23  Yes Becky Augusta, NP  benzonatate (TESSALON) 100 MG capsule Take 2 capsules (200 mg total) by mouth every 8 (eight) hours. 06/15/23  Yes Becky Augusta, NP  ipratropium (ATROVENT) 0.06 % nasal spray Place 2 sprays into both  nostrils 4 (four) times daily. 06/15/23  Yes Becky Augusta, NP  promethazine-dextromethorphan (PROMETHAZINE-DM) 6.25-15 MG/5ML syrup Take 5 mLs by mouth 4 (four) times daily as needed. 06/15/23  Yes Becky Augusta, NP  Spacer/Aero-Holding Chambers (AEROCHAMBER MV) inhaler Use as instructed 06/15/23  Yes Becky Augusta, NP  amphetamine-dextroamphetamine (ADDERALL XR) 10 MG 24 hr capsule Take one capsule daily 06/03/23     Ascorbic Acid (VITAMIN C) 1000 MG tablet Take 1,000 mg by mouth daily.    [provider]  levocetirizine (XYZAL) 5 MG tablet Take 5 mg by mouth every evening.    [provider]  meloxicam (MOBIC) 15 MG tablet Take 15 mg by mouth daily.    [provider]   Nutritional Supplements (NUTRITIONAL SUPPLEMENT PO) Take by mouth. MYO-INOSITOL 12 drop by mouth daily    [provider]  propranolol (INDERAL) 20 MG tablet Take 20 mg by mouth daily. 05/15/23   [provider]  Specialty Vitamins Products (BIOTIN PLUS KERATIN) 10000-100 MCG-MG TABS Take by mouth.    [provider]    Family History Family History  Problem Relation Age of Onset   Diabetes Mother    Depression Mother    Anxiety disorder Mother    Depression Father    Hypertension Father    Diabetes Father    Bipolar disorder Father    Hyperlipidemia Father    Diabetes Maternal Grandmother    Melanoma Maternal Grandmother    Diabetes Maternal Grandfather    Hypertension Paternal Grandmother    Hyperlipidemia Paternal Grandmother    Hypertension Paternal Grandfather    Hyperlipidemia Paternal Grandfather    Breast cancer Paternal Aunt        great aunt, age 72    Social History Social History   Tobacco Use   Smoking status: Former    Current packs/day: 0.00    Types: Cigarettes    Quit date: 07/26/2007    Years since quitting: 15.8    Passive exposure: Never   Smokeless tobacco: Never  Vaping Use   Vaping status: Never Used  Substance Use Topics   Alcohol use: No    Alcohol/week: 0.0 standard drinks of alcohol    Comment: social   Drug use: No     Allergies   Bee pollen, Cherry flavor [flavoring agent (non-screening)], Insect extract, Penicillin g, Penicillins, Adhesive [tape], Amoxicillin-pot clavulanate, Red dye #40 (allura red), and Latex   Review of Systems Review of Systems  Constitutional:  Positive for fever.  HENT:  Positive for congestion, rhinorrhea and voice change. Negative for ear pain.   Respiratory:  Positive for cough, chest tightness, shortness of breath and wheezing.   Musculoskeletal:  Positive for arthralgias and myalgias.     Physical Exam Triage Vital Signs ED Triage Vitals  Encounter Vitals Group     BP  06/15/23 1154 (!) 134/92     Systolic BP Percentile --      Diastolic BP Percentile --      Pulse Rate 06/15/23 1154 (!) 103     Resp 06/15/23 1154 15     Temp 06/15/23 1154 98.2 F (36.8 C)     Temp Source 06/15/23 1154 Oral     SpO2 06/15/23 1154 97 %     Weight --      Height --      Head Circumference --      Peak Flow --      Pain Score 06/15/23 1152 9     Pain  Loc --      Pain Education --      Exclude from Growth Chart --    No data found.  Updated Vital Signs BP (!) 134/92 (BP Location: Right Arm)   Pulse (!) 103   Temp 98.2 F (36.8 C) (Oral)   Resp 15   LMP 05/25/2023 (Approximate)   SpO2 97%   Visual Acuity Right Eye Distance:   Left Eye Distance:   Bilateral Distance:    Right Eye Near:   Left Eye Near:    Bilateral Near:     Physical Exam Vitals and nursing note reviewed.  Constitutional:      Appearance: Normal appearance. She is ill-appearing.  HENT:     Head: Normocephalic and atraumatic.     Right Ear: Tympanic membrane, ear canal and external ear normal. There is no impacted cerumen.     Left Ear: Tympanic membrane, ear canal and external ear normal. There is no impacted cerumen.     Nose: Congestion and rhinorrhea present.     Comments: Nasal mucosa is erythematous and edematous with clear discharge in both nares.    Mouth/Throat:     Mouth: Mucous membranes are moist.     Pharynx: Oropharynx is clear. No oropharyngeal exudate or posterior oropharyngeal erythema.  Cardiovascular:     Rate and Rhythm: Normal rate and regular rhythm.     Pulses: Normal pulses.     Heart sounds: Normal heart sounds. No murmur heard.    No friction rub. No gallop.  Pulmonary:     Effort: Pulmonary effort is normal.     Breath sounds: Normal breath sounds. No wheezing, rhonchi or rales.  Musculoskeletal:     Cervical back: Normal range of motion and neck supple. No tenderness.  Lymphadenopathy:     Cervical: No cervical adenopathy.  Skin:    General: Skin  is warm and dry.     Capillary Refill: Capillary refill takes less than 2 seconds.     Findings: No rash.  Neurological:     General: No focal deficit present.     Mental Status: She is alert and oriented to person, place, and time.      UC Treatments / Results  Labs (all labs ordered are listed, but only abnormal results are displayed) Labs Reviewed  RESP PANEL BY RT-PCR (FLU A&B, COVID) ARPGX2 - Abnormal; Notable for the following components:      Result Value   Influenza A by PCR POSITIVE (*)    All other components within normal limits    EKG   Radiology DG Chest 2 View Result Date: 06/15/2023 CLINICAL DATA:  Cough, wheezing, and fever for 4 days. EXAM: CHEST - 2 VIEW COMPARISON:  None Available. FINDINGS: The heart size and mediastinal contours are within normal limits. Both lungs are clear. The visualized skeletal structures are unremarkable. IMPRESSION: Normal exam. Electronically Signed   By: Danae Orleans M.D.   On: 06/15/2023 12:46    Procedures Procedures (including critical care time)  Medications Ordered in UC Medications - No data to display  Initial Impression / Assessment and Plan / UC Course  I have reviewed the triage vital signs and the nursing notes.  Pertinent labs & imaging results that were available during my care of the patient were reviewed by me and considered in my medical decision making (see chart for details).   Patient is a pleasant, though ill-appearing, 38 year old female presenting for evaluation of flulike symptoms outlined HPI above.  She has quite a hoarse voice in the exam room and she is complaining of chest tightness.  Chest auscultation reveals clear lung sounds so they are diminished diffusely.  She also has inflammation of her nasal mucosa with clear rhinorrhea.  Oropharyngeal and otoscopic exams are benign.  Her son tested positive for influenza 6 days ago.  I will order a respiratory panel to evaluate the presence of COVID or  influenza as well as a chest x-ray to evaluate for any acute cardiopulmonary pathology.  Chest x-ray independently reviewed and evaluated by me.  Impression: Lung fields are well aerated without evidence of infiltrate or effusion.  Cardiomediastinal silhouette appears normal.  Radiology overread is pending. Radiology impression states normal exam.  Respiratory panel is positive for influenza A.  I will discharge patient home with a diagnosis of influenza A.  She is outside the therapeutic window for antiviral so I will not prescribe Tamiflu at this time.  I will prescribe Tessalon Perles to help her with her cough along with Promethazine DM cough syrup.  Also Atrovent nasal spray to help with nasal congestion and postnasal drip.  Additionally, I will prescribe an albuterol inhaler with a spacer and she can take 1 to 2 puffs every 4-6 hours as needed for any shortness of breath or wheezing.  Return precautions reviewed.  Work note provided.   Final Clinical Impressions(s) / UC Diagnoses   Final diagnoses:  Acute cough  Influenza due to identified novel influenza A virus with other respiratory manifestations     Discharge Instructions      You have tested positive for influenza A.  However, you are outside the therapeutic window for Tamiflu so I will not be prescribing that at this time.  Use over-the-counter Tylenol and/or ibuprofen according the package instructions as needed for any fever or pain.  Use the albuterol inhaler, with the spacer, and take 1 to 2 puffs every 4-6 hours as needed for any chest tightness, shortness breath, or wheezing.  Use the Atrovent nasal spray, 2 squirts in each nostril every 6 hours, as needed for runny nose and postnasal drip.  Use the Tessalon Perles every 8 hours during the day.  Take them with a small sip of water.  They may give you some numbness to the base of your tongue or a metallic taste in your mouth, this is normal.  Use the Promethazine DM  cough syrup at bedtime for cough and congestion.  It will make you drowsy so do not take it during the day.  Return for reevaluation or see your primary care provider for any new or worsening symptoms.      ED Prescriptions     Medication Sig Dispense Auth. Provider   Spacer/Aero-Holding Chambers (AEROCHAMBER MV) inhaler Use as instructed 1 each Becky Augusta, NP   albuterol (VENTOLIN HFA) 108 (90 Base) MCG/ACT inhaler Inhale 2 puffs into the lungs every 4 (four) hours as needed. 18 g Becky Augusta, NP   benzonatate (TESSALON) 100 MG capsule Take 2 capsules (200 mg total) by mouth every 8 (eight) hours. 21 capsule Becky Augusta, NP   ipratropium (ATROVENT) 0.06 % nasal spray Place 2 sprays into both nostrils 4 (four) times daily. 15 mL Becky Augusta, NP   promethazine-dextromethorphan (PROMETHAZINE-DM) 6.25-15 MG/5ML syrup Take 5 mLs by mouth 4 (four) times daily as needed. 118 mL Becky Augusta, NP      PDMP not reviewed this encounter.   Becky Augusta, NP 06/15/23 1253

## 2023-06-15 NOTE — Discharge Instructions (Signed)
 You have tested positive for influenza A.  However, you are outside the therapeutic window for Tamiflu so I will not be prescribing that at this time.  Use over-the-counter Tylenol and/or ibuprofen according the package instructions as needed for any fever or pain.  Use the albuterol inhaler, with the spacer, and take 1 to 2 puffs every 4-6 hours as needed for any chest tightness, shortness breath, or wheezing.  Use the Atrovent nasal spray, 2 squirts in each nostril every 6 hours, as needed for runny nose and postnasal drip.  Use the Tessalon Perles every 8 hours during the day.  Take them with a small sip of water.  They may give you some numbness to the base of your tongue or a metallic taste in your mouth, this is normal.  Use the Promethazine DM cough syrup at bedtime for cough and congestion.  It will make you drowsy so do not take it during the day.  Return for reevaluation or see your primary care provider for any new or worsening symptoms.

## 2023-06-15 NOTE — ED Triage Notes (Signed)
 Patient states that her son was diagnosed with the flu on Monday.  Patient reports she started having cough, fever, and bodyaches on Wed.

## 2023-06-16 ENCOUNTER — Other Ambulatory Visit: Payer: Self-pay

## 2023-06-16 MED ORDER — PROPRANOLOL HCL 20 MG PO TABS
20.0000 mg | ORAL_TABLET | Freq: Two times a day (BID) | ORAL | 0 refills | Status: DC
  Filled 2023-06-16: qty 60, 30d supply, fill #0

## 2023-06-17 ENCOUNTER — Other Ambulatory Visit: Payer: Self-pay

## 2023-06-17 DIAGNOSIS — F900 Attention-deficit hyperactivity disorder, predominantly inattentive type: Secondary | ICD-10-CM | POA: Diagnosis not present

## 2023-06-17 DIAGNOSIS — F411 Generalized anxiety disorder: Secondary | ICD-10-CM | POA: Diagnosis not present

## 2023-06-17 MED ORDER — AMPHETAMINE-DEXTROAMPHET ER 10 MG PO CP24
10.0000 mg | ORAL_CAPSULE | Freq: Every day | ORAL | 0 refills | Status: DC
Start: 2023-06-17 — End: 2023-10-28
  Filled 2023-06-17 – 2023-07-17 (×2): qty 30, 30d supply, fill #0

## 2023-07-17 ENCOUNTER — Other Ambulatory Visit: Payer: Self-pay

## 2023-07-17 MED ORDER — PROPRANOLOL HCL 20 MG PO TABS
20.0000 mg | ORAL_TABLET | Freq: Two times a day (BID) | ORAL | 0 refills | Status: DC
  Filled 2023-07-17: qty 60, 30d supply, fill #0

## 2023-07-23 ENCOUNTER — Other Ambulatory Visit: Payer: Self-pay

## 2023-07-23 DIAGNOSIS — F411 Generalized anxiety disorder: Secondary | ICD-10-CM | POA: Diagnosis not present

## 2023-07-23 DIAGNOSIS — F900 Attention-deficit hyperactivity disorder, predominantly inattentive type: Secondary | ICD-10-CM | POA: Diagnosis not present

## 2023-07-23 MED ORDER — PROPRANOLOL HCL 20 MG PO TABS
20.0000 mg | ORAL_TABLET | Freq: Two times a day (BID) | ORAL | 2 refills | Status: DC
  Filled 2023-07-23 – 2023-08-14 (×2): qty 60, 30d supply, fill #0
  Filled 2023-09-16: qty 60, 30d supply, fill #1

## 2023-07-23 MED ORDER — AMPHETAMINE-DEXTROAMPHET ER 20 MG PO CP24
20.0000 mg | ORAL_CAPSULE | Freq: Every day | ORAL | 0 refills | Status: DC
  Filled 2023-07-23 – 2023-08-14 (×2): qty 30, 30d supply, fill #0

## 2023-08-14 ENCOUNTER — Other Ambulatory Visit: Payer: Self-pay

## 2023-09-16 ENCOUNTER — Other Ambulatory Visit: Payer: Self-pay

## 2023-09-16 MED ORDER — AMPHETAMINE-DEXTROAMPHET ER 20 MG PO CP24
20.0000 mg | ORAL_CAPSULE | Freq: Every day | ORAL | 0 refills | Status: DC
  Filled 2023-09-16: qty 30, 30d supply, fill #0

## 2023-09-30 ENCOUNTER — Other Ambulatory Visit: Payer: Self-pay

## 2023-09-30 DIAGNOSIS — F900 Attention-deficit hyperactivity disorder, predominantly inattentive type: Secondary | ICD-10-CM | POA: Diagnosis not present

## 2023-09-30 DIAGNOSIS — F411 Generalized anxiety disorder: Secondary | ICD-10-CM | POA: Diagnosis not present

## 2023-09-30 MED ORDER — PROPRANOLOL HCL 60 MG PO TABS
60.0000 mg | ORAL_TABLET | Freq: Every day | ORAL | 2 refills | Status: DC
  Filled 2023-09-30 – 2023-10-20 (×2): qty 60, 60d supply, fill #0

## 2023-09-30 MED ORDER — AMPHETAMINE-DEXTROAMPHET ER 20 MG PO CP24
20.0000 mg | ORAL_CAPSULE | Freq: Every day | ORAL | 0 refills | Status: DC
  Filled 2023-10-20: qty 30, 30d supply, fill #0

## 2023-10-12 DIAGNOSIS — M5416 Radiculopathy, lumbar region: Secondary | ICD-10-CM | POA: Diagnosis not present

## 2023-10-16 ENCOUNTER — Other Ambulatory Visit: Payer: Self-pay

## 2023-10-20 ENCOUNTER — Other Ambulatory Visit: Payer: Self-pay

## 2023-10-28 ENCOUNTER — Other Ambulatory Visit (HOSPITAL_COMMUNITY)
Admission: RE | Admit: 2023-10-28 | Discharge: 2023-10-28 | Disposition: A | Discharge status: Home / Self Care | Source: Ambulatory Visit | Attending: Obstetrics and Gynecology | Admitting: Obstetrics and Gynecology

## 2023-10-28 ENCOUNTER — Other Ambulatory Visit: Payer: Self-pay

## 2023-10-28 ENCOUNTER — Ambulatory Visit: Admitting: Obstetrics and Gynecology

## 2023-10-28 VITALS — BP 116/76 | HR 79 | Wt 241.0 lb

## 2023-10-28 DIAGNOSIS — N764 Abscess of vulva: Secondary | ICD-10-CM

## 2023-10-28 DIAGNOSIS — N9089 Other specified noninflammatory disorders of vulva and perineum: Secondary | ICD-10-CM

## 2023-10-28 DIAGNOSIS — F411 Generalized anxiety disorder: Secondary | ICD-10-CM | POA: Diagnosis not present

## 2023-10-28 DIAGNOSIS — F900 Attention-deficit hyperactivity disorder, predominantly inattentive type: Secondary | ICD-10-CM | POA: Diagnosis not present

## 2023-10-28 DIAGNOSIS — Z131 Encounter for screening for diabetes mellitus: Secondary | ICD-10-CM | POA: Diagnosis not present

## 2023-10-28 MED ORDER — AMPHETAMINE-DEXTROAMPHET ER 20 MG PO CP24
20.0000 mg | ORAL_CAPSULE | Freq: Every day | ORAL | 0 refills | Status: DC
  Filled 2023-10-28: qty 30, 30d supply, fill #0
  Filled 2023-11-24: qty 10, 10d supply, fill #0
  Filled 2023-11-24: qty 20, 20d supply, fill #0

## 2023-10-28 MED ORDER — PROPRANOLOL HCL 60 MG PO TABS
60.0000 mg | ORAL_TABLET | Freq: Every day | ORAL | 2 refills | Status: DC
  Filled 2023-10-28 – 2023-12-18 (×2): qty 60, 60d supply, fill #0
  Filled 2024-02-24: qty 60, 60d supply, fill #1

## 2023-10-28 MED ORDER — AMPHETAMINE-DEXTROAMPHET ER 20 MG PO CP24
20.0000 mg | ORAL_CAPSULE | Freq: Every day | ORAL | 0 refills | Status: DC
  Filled 2023-12-23: qty 30, 30d supply, fill #0

## 2023-10-28 NOTE — Progress Notes (Signed)
 RGYN here for cyst on labia on left side pt states that she has had 5 in the last week 3 have ruptured cyst only occur on left side and are very painful.  *Taking Myo Inositol & D-Chiro Inositol blend 590 to help with cycle.

## 2023-10-28 NOTE — Patient Instructions (Addendum)
   Please try just using warm compresses and avoid lancing/pressing on the areas to see if they will drain on their own  Avoid: - Synthetic underwear - Tight pants - Swim suits, thongs, leotards, leggings for prolonged periods of time - Scented soap/shampoo - Bubble baths - Scented detergents, dryer sheets - Baby wipes - Feminine sprays, douches, powders - Panty liners - Dyed toilet paper - Shaving  Trying swapping out the above for: - Cotton or no underwear - Loose pants, skirts, dresses - Changing out of swimwear, thongs, and workout gear as soon as you're done exercising - Fragrance free soaps (like Dove sensitive skin) - Warm plain water baths - Unscented laundry detergent - Use a bedet or peri bottle to rinse instead of baby wipes - Tampons, cotton pads, cotton period underwear - Undyed toilet paper - Clipping hair

## 2023-10-28 NOTE — Progress Notes (Signed)
   RETURN GYNECOLOGY VISIT  Subjective:  Brenda Gutierrez is a 38 y.o. [id number] with LMP 7/8 presenting for labial cyst.   Has been seen with something similar in the past, most recently 04/2023. At that time, she had a <57mm erythematous nodule on the left superior labia minora that was spontaneously draining. I&D done at that visit, unable to remove cyst wall at that time. Symptoms improved.   Today, she reports recurrent cysts in the left labia minora. Has had 5 in the past 3 weeks. She will lance them herself with sterile instruments and get drainage of pus or blood. They sometimes drain spontaneously. She has deeper ones that won't drain even after trying to lance them. They are painful with movement, wiping or even at rest. No fevers/chills. They pop up in different spots on the left labia minora and drain within a couple of days. No other lesions anywhere else on the body. Has sensitive skin so has tried avoiding synthetic underwear, minimizing time in leggings/workout clothes, wearing loose fitting bottoms, using sensitive skin formulations of detergent/soap and doesn't use dryer sheets.   Due for pap  Objective:   Vitals:   10/28/23 1311  BP: 116/76  Pulse: 79  Weight: 241 lb (109.3 kg)    General:  Alert, oriented and cooperative. Patient is in no acute distress.  Skin: Skin is warm and dry. No rash noted.   Cardiovascular: Normal heart rate noted  Respiratory: Normal respiratory effort, no problems with respiration noted  Abdomen: Soft, non-tender, non-distended   Pelvic: Small 2-68mm area of firmness within the left labia minora without overlying skin changes. Non tender. No e/o abscess. No other lesions.  Exam performed in the presence of a chaperone  Assessment and Plan:  Brenda Gutierrez is a 38 y.o. with   Recurrent vulvar lesion/abscess Unclear etiology of her lesions. No e/o HSV or abscess on exam today. Has small 2-61mm nodule within the left labia minora that is  non fluctuant c/w potential scarring? Given that they are moving/resolving, don't think this is an epidermoid cyst and there is nothing to surgically remove on exam today. Lesions are ONLY in the left labia minora.  Discussed vulvar care, warm compresses and avoidance of picking/touching/lancing these lesions. Will also take pictures at height of symptoms to help us  determine diagnosis.  -     Cervicovaginal ancillary only( [place]) -     HgB A1c -     HIV antibody (with reflex) -     RPR  Return in about 4 weeks (around 11/25/2023) for annual exam with pap.  Future Appointments  Date Time Provider Department Center  11/26/2023  1:10 PM Izell Harari, MD [facility]    Kieth JAYSON Carolin, MD

## 2023-10-29 ENCOUNTER — Other Ambulatory Visit: Payer: Self-pay

## 2023-10-29 ENCOUNTER — Ambulatory Visit: Payer: Self-pay | Admitting: Obstetrics and Gynecology

## 2023-10-29 DIAGNOSIS — M5416 Radiculopathy, lumbar region: Secondary | ICD-10-CM | POA: Diagnosis not present

## 2023-10-29 DIAGNOSIS — M5442 Lumbago with sciatica, left side: Secondary | ICD-10-CM | POA: Diagnosis not present

## 2023-10-29 LAB — CERVICOVAGINAL ANCILLARY ONLY
Bacterial Vaginitis (gardnerella): NEGATIVE
Candida Glabrata: NEGATIVE
Candida Vaginitis: NEGATIVE
Chlamydia: NEGATIVE
Comment: NEGATIVE
Comment: NEGATIVE
Comment: NEGATIVE
Comment: NEGATIVE
Comment: NEGATIVE
Comment: NORMAL
Neisseria Gonorrhea: NEGATIVE
Trichomonas: NEGATIVE

## 2023-10-29 LAB — HEMOGLOBIN A1C
Est. average glucose Bld gHb Est-mCnc: 114 mg/dL
Hgb A1c MFr Bld: 5.6 % (ref 4.8–5.6)

## 2023-10-29 LAB — RPR: RPR Ser Ql: NONREACTIVE

## 2023-10-29 LAB — HIV ANTIBODY (ROUTINE TESTING W REFLEX): HIV Screen 4th Generation wRfx: NONREACTIVE

## 2023-11-24 ENCOUNTER — Other Ambulatory Visit: Payer: Self-pay

## 2023-11-26 ENCOUNTER — Encounter: Payer: Self-pay | Admitting: Obstetrics and Gynecology

## 2023-11-26 ENCOUNTER — Ambulatory Visit: Admitting: Obstetrics and Gynecology

## 2023-11-26 ENCOUNTER — Other Ambulatory Visit (HOSPITAL_COMMUNITY)
Admission: RE | Admit: 2023-11-26 | Discharge: 2023-11-26 | Disposition: A | Discharge status: Home / Self Care | Source: Ambulatory Visit | Attending: Obstetrics and Gynecology | Admitting: Obstetrics and Gynecology

## 2023-11-26 VITALS — BP 106/71 | HR 78 | Ht 64.0 in | Wt 239.0 lb

## 2023-11-26 DIAGNOSIS — Z124 Encounter for screening for malignant neoplasm of cervix: Secondary | ICD-10-CM | POA: Diagnosis not present

## 2023-11-26 DIAGNOSIS — E282 Polycystic ovarian syndrome: Secondary | ICD-10-CM

## 2023-11-26 DIAGNOSIS — Z01419 Encounter for gynecological examination (general) (routine) without abnormal findings: Secondary | ICD-10-CM

## 2023-11-26 NOTE — Progress Notes (Signed)
 Obstetrics and Gynecology Annual Patient Evaluation  Appointment Date: 11/26/2023  OBGYN Clinic: [facility]   Primary Care Provider: Corwin Antu  Chief Complaint:  Chief Complaint  Patient presents with   Annual Exam    History of Present Illness: Brenda Gutierrez is a 38 y.o.  [phone] (Patient's last menstrual period was 11/21/2023 (approximate).), seen for the above chief complaint. Her past medical history is significant for PCOS, h/o BTL   Review of Systems: Pertinent items are noted in HPI.   Patient Active Problem List   Diagnosis Date Noted   Urinary urgency 12/27/2022   Upper respiratory tract infection 04/01/2022   Eustachian tube dysfunction, bilateral 04/01/2022   Sinus pressure 04/01/2022   Acute non-recurrent frontal sinusitis 02/04/2022   Menses painful 10/19/2021   PCOS (polycystic ovarian syndrome) 10/19/2021   Obesity (BMI 30-39.9) 08/17/2021   Generalized anxiety disorder 08/17/2021   Goiter 07/15/2021   Hirsutism 07/15/2021   Acne vulgaris 07/15/2021   Alopecia 07/12/2021   Absolute anemia 07/12/2021   Family history of diabetes mellitus in mother 07/12/2021   Vitamin D  deficiency 07/12/2021   BMI 39.0-39.9,adult 07/12/2021   Anxiety 02/23/2018    Past Medical History:  Past Medical History:  Diagnosis Date   Common migraine with intractable migraine 01/04/2014   Headache    migraines   History of mastitis 03/13/2018   Morbid obesity (HCC)    Past Surgical History:  Past Surgical History:  Procedure Laterality Date   CESAREAN SECTION N/A 07/21/2015   Procedure: CESAREAN SECTION;  Surgeon: Archie Savers, MD;  Location: [facility] ORS;  Service: Obstetrics;  Laterality: N/A;   CESAREAN SECTION N/A 11/29/2017   Procedure: CESAREAN SECTION;  Surgeon: Edsel Norleen GAILS, MD;  Location: [facility] BIRTHING SUITES;  Service: Obstetrics;  Laterality: N/A;   OVARIAN CYST REMOVAL Left    TONSILLECTOMY     TUBAL LIGATION Bilateral  11/29/2017   Procedure: BILATERAL TUBAL LIGATION;  Surgeon: Edsel Norleen GAILS, MD;  Location: [facility] SU[facility];  Service: Obstetrics;  Laterality: Bilateral;  Filshie Clips   TUMOR REMOVAL  2003   throat-benign   TUMOR REMOVAL  2005   right breast-benign   Past Obstetrical History:  OB History  Gravida Para Term Preterm AB Living  2 2 2  0 0 2  SAB IAB Ectopic Multiple Live Births  0 0 0 0 2    # Outcome Date GA Lbr Len/2nd Weight Sex Type Anes PTL Lv  2 Term 11/29/17 [redacted]w[redacted]d  7 lb 7.4 oz (3.385 kg) M CS-LVertical Spinal  LIV  1 Term 07/21/15 [redacted]w[redacted]d  7 lb 4.8 oz (3.31 kg) F CS-LVertical Spinal  LIV     Birth Comments: none observed     Complications: Fetal Intolerance    Obstetric Comments  Cesarean section was a low transverse incision, not a low vertical (this is an error)   Past Gynecological History: As per HPI. Periods: qmonth, regular, approximately qwk  History of Pap Smear(s): 10/2020, which was negative  Social History:  Social History   Socioeconomic History   Marital status: Married    Spouse name: Elsie   Number of children: 2   Years of education: 16   Highest education level: Bachelor's degree (e.g., BA, AB, BS)  Occupational History   Occupation: fitness instructor  Tobacco Use   Smoking status: Former    Current packs/day: 0.00    Types: Cigarettes    Quit date: 07/26/2007    Years since quitting: 16.3  Passive exposure: Never   Smokeless tobacco: Never  Vaping Use   Vaping status: Never Used  Substance and Sexual Activity   Alcohol use: No    Alcohol/week: 0.0 standard drinks of alcohol    Comment: social   Drug use: No   Sexual activity: Not Currently    Partners: Male    Birth control/protection: None  Other Topics Concern   Not on file  Social History Narrative   39 y/o boy and 38 y/o female   Married   Marketing executive   Son with growth hormone disorder   Social Drivers of Corporate investment banker Strain: Low Risk  (11/28/2017)    Overall Financial Resource Strain (CARDIA)    Difficulty of Paying Living Expenses: Not hard at all  Food Insecurity: No Food Insecurity (11/28/2017)   Hunger Vital Sign    Worried About Running Out of Food in the Last Year: Never true    Ran Out of Food in the Last Year: Never true  Transportation Needs: No Transportation Needs (11/28/2017)   PRAPARE - Administrator, Civil Service (Medical): No    Lack of Transportation (Non-Medical): No  Physical Activity: Sufficiently Active (11/28/2017)   Exercise Vital Sign    Days of Exercise per Week: 5 days    Minutes of Exercise per Session: 50 min  Stress: No Stress Concern Present (11/28/2017)   [employer] of Occupational Health - Occupational Stress Questionnaire    Feeling of Stress : Not at all  Social Connections: Moderately Integrated (11/28/2017)   Social Connection and Isolation Panel    Frequency of Communication with Friends and Family: More than three times a week    Frequency of Social Gatherings with Friends and Family: Once a week    Attends Religious Services: Never    Database administrator or Organizations: Yes    Attends Engineer, structural: More than 4 times per year    Marital Status: Married  Catering manager Violence: Not At Risk (11/28/2017)   Humiliation, Afraid, Rape, and Kick questionnaire    Fear of Current or Ex-Partner: No    Emotionally Abused: No    Physically Abused: No    Sexually Abused: No   Family History:  Family History  Problem Relation Age of Onset   Diabetes Mother    Depression Mother    Anxiety disorder Mother    Depression Father    Hypertension Father    Diabetes Father    Bipolar disorder Father    Hyperlipidemia Father    Diabetes Maternal Grandmother    Melanoma Maternal Grandmother    Diabetes Maternal Grandfather    Hypertension Paternal Grandmother    Hyperlipidemia Paternal Grandmother    Hypertension Paternal Grandfather    Hyperlipidemia Paternal  Grandfather    Breast cancer Paternal Aunt        great aunt, age 71    Medications Jeraline B. Gitto had no medications administered during this visit. Current Outpatient Medications  Medication Sig Dispense Refill   albuterol  (VENTOLIN  HFA) 108 (90 Base) MCG/ACT inhaler Inhale 2 puffs into the lungs every 4 (four) hours as needed. 18 g 0   amphetamine -dextroamphetamine  (ADDERALL XR) 20 MG 24 hr capsule Take 1 capsule (20 mg total) by mouth daily. 30 capsule 0   Nutritional Supplements (NUTRITIONAL SUPPLEMENT PO) Take by mouth. MYO-INOSITOL 12 drop by mouth daily     propranolol  (INDERAL ) 60 MG tablet Take 1 tablet (60 mg total) by  mouth daily. 60 tablet 2   amphetamine -dextroamphetamine  (ADDERALL XR) 20 MG 24 hr capsule Take 1 capsule (20 mg total) by mouth daily. 30 capsule 0   amphetamine -dextroamphetamine  (ADDERALL XR) 20 MG 24 hr capsule Take 1 capsule (20 mg total) by mouth daily. 30 capsule 0   Ascorbic Acid (VITAMIN C) 1000 MG tablet Take 1,000 mg by mouth daily.     benzonatate  (TESSALON ) 100 MG capsule Take 2 capsules (200 mg total) by mouth every 8 (eight) hours. (Patient not taking: Reported on 11/26/2023) 21 capsule 0   ipratropium (ATROVENT ) 0.06 % nasal spray Place 2 sprays into both nostrils 4 (four) times daily. 15 mL 12   levocetirizine (XYZAL) 5 MG tablet Take 5 mg by mouth every evening.     meloxicam (MOBIC) 15 MG tablet Take 15 mg by mouth daily. (Patient not taking: Reported on 11/26/2023)     promethazine -dextromethorphan (PROMETHAZINE -DM) 6.25-15 MG/5ML syrup Take 5 mLs by mouth 4 (four) times daily as needed. (Patient not taking: Reported on 10/28/2023) 118 mL 0   propranolol  (INDERAL ) 60 MG tablet Take 1 tablet (60 mg total) by mouth daily. 60 tablet 2   Spacer/Aero-Holding Chambers (AEROCHAMBER MV) inhaler Use as instructed 1 each 2   Specialty Vitamins Products (BIOTIN PLUS KERATIN) 10000-100 MCG-MG TABS Take by mouth. (Patient not taking: Reported on 11/26/2023)      No current facility-administered medications for this visit.   Allergies Bee pollen, Cherry flavor [flavoring agent (non-screening)], Insect extract, Penicillin g, Penicillins, Adhesive [tape], Amoxicillin-pot clavulanate, Red dye #40 (allura red), and Latex  Physical Exam:  BP 106/71   Pulse 78   Ht 5' 4 (1.626 m)   Wt 239 lb (108.4 kg)   LMP 11/21/2023 (Approximate)   BMI 41.02 kg/m  Body mass index is 41.02 kg/m.  General appearance: Well nourished, well developed female in no acute distress.  Neck:  Supple, normal appearance, and no thyromegaly  Cardiovascular: normal s1 and s2.  No murmurs, rubs or gallops. Respiratory:  Clear to auscultation bilateral. Normal respiratory effort Abdomen: positive bowel sounds and no masses, hernias; diffusely non tender to palpation, non distended Breasts: breasts appear normal, no suspicious masses, no skin or nipple changes or axillary nodes, and normal palpation. Neuro/Psych:  Normal mood and affect.  Skin:  Warm and dry.  Lymphatic:  No inguinal lymphadenopathy.   Cervical exam performed in the presence of a chaperone Pelvic exam: is not limited by body habitus EGBUS: within normal limits Vagina: within normal limits and with no discharge in the vault; scant old blood in vault Cervix: normal appearing cervix without tenderness, discharge or lesions. Uterus:  nonenlarged and non tender Adnexa:  normal adnexa and no mass, fullness, tenderness Rectovaginal: deferred  Laboratory: none  Radiology: none  Assessment: patient doing well  Plan:  1. Well woman exam with routine gynecological exam (Primary) - Cytology - PAP  2. Cervical cancer screening - Cytology - PAP  3. PCOS (polycystic ovarian syndrome) Helped greatly in terms of regulating periods and weight loss with use of an inositol.  RTC PRN  Return if symptoms worsen or fail to improve.  No future appointments.  Bebe Izell Raddle MD Attending Center for  [employer] [facility])

## 2023-11-26 NOTE — Progress Notes (Signed)
 Patient presents for Annual.  LMP: Patient's last menstrual period was 11/21/2023 (approximate).  Last pap: Date: 2022-WNL Contraception: Tubal ligation  Mammogram: Not yet indicated STD Screening: Declines Flu Vaccine : N/A  CC: Annual

## 2023-12-01 LAB — CYTOLOGY - PAP
Comment: NEGATIVE
Diagnosis: NEGATIVE
High risk HPV: NEGATIVE

## 2023-12-02 ENCOUNTER — Ambulatory Visit: Payer: Self-pay | Admitting: Obstetrics and Gynecology

## 2023-12-18 ENCOUNTER — Other Ambulatory Visit: Payer: Self-pay

## 2023-12-18 DIAGNOSIS — D2272 Melanocytic nevi of left lower limb, including hip: Secondary | ICD-10-CM | POA: Diagnosis not present

## 2023-12-18 DIAGNOSIS — L538 Other specified erythematous conditions: Secondary | ICD-10-CM | POA: Diagnosis not present

## 2023-12-18 DIAGNOSIS — L720 Epidermal cyst: Secondary | ICD-10-CM | POA: Diagnosis not present

## 2023-12-18 DIAGNOSIS — D225 Melanocytic nevi of trunk: Secondary | ICD-10-CM | POA: Diagnosis not present

## 2023-12-18 DIAGNOSIS — R208 Other disturbances of skin sensation: Secondary | ICD-10-CM | POA: Diagnosis not present

## 2023-12-18 DIAGNOSIS — D2261 Melanocytic nevi of right upper limb, including shoulder: Secondary | ICD-10-CM | POA: Diagnosis not present

## 2023-12-18 DIAGNOSIS — L728 Other follicular cysts of the skin and subcutaneous tissue: Secondary | ICD-10-CM | POA: Diagnosis not present

## 2023-12-18 DIAGNOSIS — D2262 Melanocytic nevi of left upper limb, including shoulder: Secondary | ICD-10-CM | POA: Diagnosis not present

## 2023-12-19 ENCOUNTER — Other Ambulatory Visit: Payer: Self-pay

## 2023-12-23 ENCOUNTER — Other Ambulatory Visit: Payer: Self-pay

## 2023-12-26 DIAGNOSIS — R300 Dysuria: Secondary | ICD-10-CM | POA: Diagnosis not present

## 2023-12-26 DIAGNOSIS — N23 Unspecified renal colic: Secondary | ICD-10-CM | POA: Diagnosis not present

## 2023-12-27 DIAGNOSIS — N23 Unspecified renal colic: Secondary | ICD-10-CM | POA: Diagnosis not present

## 2023-12-29 ENCOUNTER — Other Ambulatory Visit: Payer: Self-pay

## 2023-12-29 DIAGNOSIS — F411 Generalized anxiety disorder: Secondary | ICD-10-CM | POA: Diagnosis not present

## 2023-12-29 DIAGNOSIS — F900 Attention-deficit hyperactivity disorder, predominantly inattentive type: Secondary | ICD-10-CM | POA: Diagnosis not present

## 2023-12-29 MED ORDER — AMPHETAMINE-DEXTROAMPHET ER 20 MG PO CP24
20.0000 mg | ORAL_CAPSULE | Freq: Every day | ORAL | 0 refills | Status: DC
  Filled 2024-02-26: qty 16, 16d supply, fill #0
  Filled 2024-02-26: qty 14, 14d supply, fill #0

## 2023-12-29 MED ORDER — PROPRANOLOL HCL 60 MG PO TABS: 60.0000 mg | ORAL_TABLET | Freq: Every day | ORAL | 2 refills | Status: DC

## 2023-12-29 MED ORDER — AMPHETAMINE-DEXTROAMPHET ER 20 MG PO CP24
20.0000 mg | ORAL_CAPSULE | Freq: Every day | ORAL | 0 refills | Status: DC
  Filled 2024-01-28: qty 20, 20d supply, fill #0
  Filled 2024-01-28: qty 10, 10d supply, fill #0

## 2024-01-28 ENCOUNTER — Other Ambulatory Visit: Payer: Self-pay

## 2024-02-15 ENCOUNTER — Telehealth: Admitting: Physician Assistant

## 2024-02-15 DIAGNOSIS — R3989 Other symptoms and signs involving the genitourinary system: Secondary | ICD-10-CM

## 2024-02-15 MED ORDER — SULFAMETHOXAZOLE-TRIMETHOPRIM 800-160 MG PO TABS: 1.0000 | ORAL_TABLET | Freq: Two times a day (BID) | ORAL | 0 refills | Status: AC

## 2024-02-15 MED ORDER — FLUCONAZOLE 150 MG PO TABS: 150.0000 mg | ORAL_TABLET | ORAL | 0 refills | Status: AC

## 2024-02-15 NOTE — Addendum Note (Signed)
 Addended byBETHA ROLAN BERTHOLD on: 02/15/2024 12:37 PM   Modules accepted: Orders

## 2024-02-15 NOTE — Progress Notes (Signed)

## 2024-02-23 ENCOUNTER — Other Ambulatory Visit: Payer: Self-pay

## 2024-02-23 DIAGNOSIS — F411 Generalized anxiety disorder: Secondary | ICD-10-CM | POA: Diagnosis not present

## 2024-02-23 DIAGNOSIS — F900 Attention-deficit hyperactivity disorder, predominantly inattentive type: Secondary | ICD-10-CM | POA: Diagnosis not present

## 2024-02-23 MED ORDER — HYDROXYZINE HCL 10 MG PO TABS
10.0000 mg | ORAL_TABLET | Freq: Four times a day (QID) | ORAL | 0 refills | Status: DC | PRN
  Filled 2024-02-23: qty 90, 23d supply, fill #0

## 2024-02-23 MED ORDER — AMPHETAMINE-DEXTROAMPHET ER 20 MG PO CP24: 20.0000 mg | ORAL_CAPSULE | Freq: Every day | ORAL | 0 refills | Status: DC

## 2024-02-23 MED ORDER — PROPRANOLOL HCL 60 MG PO TABS
60.0000 mg | ORAL_TABLET | Freq: Every day | ORAL | 2 refills | Status: AC
  Filled 2024-02-23: qty 60, 60d supply, fill #0
  Filled 2024-04-26: qty 60, 60d supply, fill #1

## 2024-02-24 ENCOUNTER — Other Ambulatory Visit: Payer: Self-pay

## 2024-02-26 ENCOUNTER — Other Ambulatory Visit: Payer: Self-pay

## 2024-03-29 ENCOUNTER — Other Ambulatory Visit: Payer: Self-pay

## 2024-04-19 ENCOUNTER — Other Ambulatory Visit: Payer: Self-pay

## 2024-04-19 MED ORDER — BUSPIRONE HCL 5 MG PO TABS
5.0000 mg | ORAL_TABLET | Freq: Two times a day (BID) | ORAL | 0 refills | Status: DC
  Filled 2024-04-19: qty 60, 30d supply, fill #0

## 2024-04-19 MED ORDER — AMPHETAMINE-DEXTROAMPHET ER 30 MG PO CP24
30.0000 mg | ORAL_CAPSULE | Freq: Every day | ORAL | 0 refills | Status: DC
  Filled 2024-04-19: qty 30, 30d supply, fill #0

## 2024-04-26 ENCOUNTER — Other Ambulatory Visit: Payer: Self-pay

## 2024-04-28 ENCOUNTER — Other Ambulatory Visit: Payer: Self-pay

## 2024-04-29 ENCOUNTER — Ambulatory Visit: Payer: Self-pay

## 2024-04-29 NOTE — Telephone Encounter (Signed)
" °  FYI Only or Action Required?: Action required by provider: request for appointment.  Patient was last seen in primary care on 12/06/2022 by Brenda Gutierrez BRAVO, MD.  Called Nurse Triage reporting Breast Mass.  Symptoms began a week ago.  Interventions attempted: Nothing.  Symptoms are: unchanged. Mass right breast at armpit. Painful.  Triage Disposition: See PCP When Office is Open (Within 3 Days)  Patient/caregiver understands and will follow disposition?: Yes     Copied from CRM #[id number]. Topic: Clinical - Red Word Triage >> Apr 29, 2024  8:31 AM Deaijah H wrote: Red Word that prompted transfer to Nurse Triage: Mass near breast or armpit that's constant pain. Sharp stabbing pain, kind of severe Reason for Disposition  Breast lump  Answer Assessment - Initial Assessment Questions 1. SYMPTOM: What's the main symptom you're concerned about?  (e.g., lump, nipple discharge, pain, rash)     Mass, pain 2. LOCATION: Where is the  located?     Right breast at arm pit 3. ONSET: When did   start?     1 week 4. PRIOR HISTORY: Do you have any history of prior problems with your breasts? (e.g., breast cancer, breast implant, fibrocystic breast disease)     yes 5. CAUSE: What do you think is causing this symptom?     unsure 6. OTHER SYMPTOMS: Do you have any other symptoms? (e.g., breast pain, fever, nipple discharge, redness or rash)     pain 7. PREGNANCY-BREASTFEEDING: Is there any chance you are pregnant? When was your last menstrual period? Are you breastfeeding?     no  Protocols used: Breast Symptoms-A-AH  "

## 2024-04-30 ENCOUNTER — Ambulatory Visit: Admitting: Family Medicine

## 2024-04-30 ENCOUNTER — Encounter: Payer: Self-pay | Admitting: Family Medicine

## 2024-04-30 VITALS — BP 118/70 | HR 86 | Temp 97.8°F | Ht 64.0 in | Wt 233.0 lb

## 2024-04-30 DIAGNOSIS — Z803 Family history of malignant neoplasm of breast: Secondary | ICD-10-CM

## 2024-04-30 DIAGNOSIS — R2231 Localized swelling, mass and lump, right upper limb: Secondary | ICD-10-CM

## 2024-04-30 NOTE — Progress Notes (Signed)
 "  Acute Office Visit  Subjective:     Patient ID: Brenda Gutierrez, female    DOB: November 17, 1985, 39 y.o.   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 Noticed x1 week, not wearing deodorant under R arm since noticing.     HPI Patient is in today for concerns of breast mass. She is a pleasant 39 year old female, and a new patient to me. She noticed the mass within the last one week. She normally performs self breast exams. She does voices associated discomfort and pain in the axilla region. She denies breast pain. She denies breast asymmetry. She denies nipple drainage or other exudate. She reports surgical hx includes prior removal of mass from the right axillary region, found to be benign, approximately 20 years ago.  She does endorse family hx of breast cancer paternal grandmother and paternal great aunt. Unknown if genetic link, however diagnosed in 59s and 79s.    Review of Systems  Constitutional:  Negative for chills, fever and malaise/fatigue.        Objective:    BP 118/70   Pulse 86   Temp 97.8 F (36.6 C)   Ht 5' 4 (1.626 m)   Wt 233 lb (105.7 kg)   BMI 39.99 kg/m    Physical Exam Constitutional:      Appearance: Normal appearance.  Cardiovascular:     Rate and Rhythm: Normal rate and regular rhythm.  Pulmonary:     Effort: Pulmonary effort is normal.     Breath sounds: Normal breath sounds.  Chest:  Breasts:    Breasts are symmetrical.     Right: Inverted nipple present. No swelling, bleeding, nipple discharge or tenderness.     Left: No swelling, bleeding, inverted nipple, nipple discharge, skin change or tenderness.     Comments: Right axilla palpable mass with localized swelling and light blue discoloration Skin:    General: Skin is warm and dry.     Comments: Localized light blue discoloration in reference to palpable right axilla mass  Neurological:     General: No focal  deficit present.     Mental Status: She is alert.  Psychiatric:        Mood and Affect: Mood normal.        Behavior: Behavior normal.          Assessment & Plan:   Assessment & Plan Mass of right axilla Palpable mass, tender to palpation in the right axillary region. Noted light blue discoloration. Localized swelling, hardened. No drainage or exudate present at time of exam.  As noted, prior hx of surgical removal of benign mass and family hx of breast cancer. Will note right nipple inversion without drainage or other exudate.  -Bilateral diagnostic mammogram ordered STAT -Right breast ultrasound including right axilla ordered STAT -Advised to contact office on Monday if mamogram and ultrasound have not been scheduled and/or she has not been contacted Orders:   US  BREAST COMPLETE UNI RIGHT INC AXILLA; Future   MM 3D DIAGNOSTIC MAMMOGRAM BILATERAL BREAST; Future  Family history of breast cancer Family hx of breast cancer affecting paternal grandmother and paternal great aunt. She reports she was previously receiving regular mammograms every 1 to 2 years due to previous surgical intervention and family hx however it has been several years since last mammogram. I am unable to locate other mammograms from the past.   Orders:  MM 3D DIAGNOSTIC MAMMOGRAM BILATERAL BREAST; Future     Return if symptoms worsen or fail to improve.  Brenda LOISE CORE, FNP   "

## 2024-05-03 ENCOUNTER — Other Ambulatory Visit: Payer: Self-pay | Admitting: Family Medicine

## 2024-05-03 DIAGNOSIS — R2231 Localized swelling, mass and lump, right upper limb: Secondary | ICD-10-CM

## 2024-05-03 DIAGNOSIS — R928 Other abnormal and inconclusive findings on diagnostic imaging of breast: Secondary | ICD-10-CM

## 2024-05-03 DIAGNOSIS — Z803 Family history of malignant neoplasm of breast: Secondary | ICD-10-CM

## 2024-05-05 ENCOUNTER — Ambulatory Visit
Admission: RE | Admit: 2024-05-05 | Discharge: 2024-05-05 | Disposition: A | Discharge status: Home / Self Care | Source: Ambulatory Visit | Attending: Family Medicine | Admitting: Family Medicine

## 2024-05-05 ENCOUNTER — Ambulatory Visit: Payer: Self-pay | Admitting: Family Medicine

## 2024-05-05 ENCOUNTER — Telehealth: Payer: Self-pay | Admitting: Family Medicine

## 2024-05-05 DIAGNOSIS — Z803 Family history of malignant neoplasm of breast: Secondary | ICD-10-CM | POA: Insufficient documentation

## 2024-05-05 DIAGNOSIS — R2231 Localized swelling, mass and lump, right upper limb: Secondary | ICD-10-CM

## 2024-05-05 DIAGNOSIS — R928 Other abnormal and inconclusive findings on diagnostic imaging of breast: Secondary | ICD-10-CM

## 2024-05-05 NOTE — Telephone Encounter (Signed)
 Message to PCP regarding short interval follow up imaging needed in 6 months including bilateral diagnostic mammogram and ultrasound.

## 2024-05-06 ENCOUNTER — Telehealth: Admitting: Physician Assistant

## 2024-05-06 ENCOUNTER — Other Ambulatory Visit: Payer: Self-pay

## 2024-05-06 DIAGNOSIS — T3695XA Adverse effect of unspecified systemic antibiotic, initial encounter: Secondary | ICD-10-CM | POA: Diagnosis not present

## 2024-05-06 DIAGNOSIS — B379 Candidiasis, unspecified: Secondary | ICD-10-CM

## 2024-05-06 DIAGNOSIS — B9689 Other specified bacterial agents as the cause of diseases classified elsewhere: Secondary | ICD-10-CM

## 2024-05-06 DIAGNOSIS — J019 Acute sinusitis, unspecified: Secondary | ICD-10-CM

## 2024-05-06 MED ORDER — FLUCONAZOLE 150 MG PO TABS
150.0000 mg | ORAL_TABLET | ORAL | 0 refills | Status: DC | PRN
  Filled 2024-05-06: qty 2, 6d supply, fill #0

## 2024-05-06 MED ORDER — DOXYCYCLINE HYCLATE 100 MG PO TABS
100.0000 mg | ORAL_TABLET | Freq: Two times a day (BID) | ORAL | 0 refills | Status: DC
  Filled 2024-05-06: qty 14, 7d supply, fill #0

## 2024-05-06 NOTE — Progress Notes (Signed)
"      E-Visit for Sinus Problems  We are sorry that you are not feeling well.  Here is how we plan to help!  Based on what you have shared with me it looks like you have sinusitis.  Sinusitis is inflammation and infection in the sinus cavities of the head.  Based on your presentation I believe you most likely have Acute Bacterial Sinusitis.  This is an infection caused by bacteria and is treated with antibiotics. I have prescribed Doxycycline  100mg  by mouth twice a day for 7 days. Diflucan  given as prophylaxis as patient tends to get vaginal yeast infections with antibiotic use.  You may use an oral decongestant such as Mucinex D or if you have glaucoma or high blood pressure use plain Mucinex. Saline nasal spray help and can safely be used as often as needed for congestion.  If you develop worsening sinus pain, fever or notice severe headache and vision changes, or if symptoms are not better after completion of antibiotic, please schedule an appointment with a health care provider.    Sinus infections are not as easily transmitted as other respiratory infection, however we still recommend that you avoid close contact with loved ones, especially the very young and elderly.  Remember to wash your hands thoroughly throughout the day as this is the number one way to prevent the spread of infection!  Home Care: Only take medications as instructed by your medical team. Complete the entire course of an antibiotic. Do not take these medications with alcohol. A steam or ultrasonic humidifier can help congestion.  You can place a towel over your head and breathe in the steam from hot water coming from a faucet. Avoid close contacts especially the very young and the elderly. Cover your mouth when you cough or sneeze. Always remember to wash your hands.  Get Help Right Away If: You develop worsening fever or sinus pain. You develop a severe head ache or visual changes. Your symptoms persist after you have  completed your treatment plan.  Make sure you Understand these instructions. Will watch your condition. Will get help right away if you are not doing well or get worse.  Your e-visit answers were reviewed by a board certified advanced clinical practitioner to complete your personal care plan.  Depending on the condition, your plan could have included both over the counter or prescription medications.  If there is a problem please reply  once you have received a response from your provider.  Your safety is important to us .  If you have drug allergies check your prescription carefully.    You can use MyChart to ask questions about todays visit, request a non-urgent call back, or ask for a work or school excuse for 24 hours related to this e-Visit. If it has been greater than 24 hours you will need to follow up with your provider, or enter a new e-Visit to address those concerns.  You will get an e-mail in the next two days asking about your experience.  I hope that your e-visit has been valuable and will speed your recovery. Thank you for using e-visits.  I have spent 5 minutes in review of e-visit questionnaire, review and updating patient chart, medical decision making and response to patient.   Delon CHRISTELLA Dickinson, PA-C     "

## 2024-05-07 ENCOUNTER — Other Ambulatory Visit: Payer: Self-pay

## 2024-05-11 ENCOUNTER — Other Ambulatory Visit: Payer: Self-pay | Admitting: Family

## 2024-05-11 DIAGNOSIS — R928 Other abnormal and inconclusive findings on diagnostic imaging of breast: Secondary | ICD-10-CM

## 2024-05-12 ENCOUNTER — Other Ambulatory Visit: Payer: Self-pay

## 2024-05-12 MED ORDER — AMPHETAMINE-DEXTROAMPHET ER 30 MG PO CP24
30.0000 mg | ORAL_CAPSULE | Freq: Every day | ORAL | 0 refills | Status: AC
  Filled 2024-05-12 – 2024-05-20 (×2): qty 30, 30d supply, fill #0

## 2024-05-12 MED ORDER — BUSPIRONE HCL 5 MG PO TABS
5.0000 mg | ORAL_TABLET | Freq: Two times a day (BID) | ORAL | 1 refills | Status: AC
  Filled 2024-05-12: qty 60, 30d supply, fill #0

## 2024-05-12 MED ORDER — PROPRANOLOL HCL 60 MG PO TABS
60.0000 mg | ORAL_TABLET | Freq: Every day | ORAL | 2 refills | Status: AC
  Filled 2024-05-12: qty 60, 60d supply, fill #0

## 2024-05-14 ENCOUNTER — Telehealth: Admitting: Family Medicine

## 2024-05-14 ENCOUNTER — Telehealth: Payer: Self-pay | Admitting: Family

## 2024-05-14 ENCOUNTER — Telehealth: Admitting: Physician Assistant

## 2024-05-14 ENCOUNTER — Other Ambulatory Visit: Payer: Self-pay

## 2024-05-14 DIAGNOSIS — B9689 Other specified bacterial agents as the cause of diseases classified elsewhere: Secondary | ICD-10-CM

## 2024-05-14 DIAGNOSIS — T3695XA Adverse effect of unspecified systemic antibiotic, initial encounter: Secondary | ICD-10-CM | POA: Diagnosis not present

## 2024-05-14 DIAGNOSIS — J019 Acute sinusitis, unspecified: Secondary | ICD-10-CM | POA: Diagnosis not present

## 2024-05-14 DIAGNOSIS — B379 Candidiasis, unspecified: Secondary | ICD-10-CM

## 2024-05-14 MED ORDER — FLUCONAZOLE 150 MG PO TABS
150.0000 mg | ORAL_TABLET | ORAL | 0 refills | Status: AC | PRN
  Filled 2024-05-14: qty 3, 9d supply, fill #0

## 2024-05-14 MED ORDER — SULFAMETHOXAZOLE-TRIMETHOPRIM 800-160 MG PO TABS
1.0000 | ORAL_TABLET | Freq: Two times a day (BID) | ORAL | 0 refills | Status: AC
  Filled 2024-05-14: qty 14, 7d supply, fill #0

## 2024-05-14 MED ORDER — PREDNISONE 20 MG PO TABS
40.0000 mg | ORAL_TABLET | Freq: Every day | ORAL | 0 refills | Status: AC
  Filled 2024-05-14: qty 14, 7d supply, fill #0

## 2024-05-14 NOTE — Progress Notes (Signed)
 " Virtual Visit Consent   Brenda Gutierrez, you are scheduled for a virtual visit with a [facility] provider today. Just as with appointments in the office, your consent must be obtained to participate. Your consent will be active for this visit and any virtual visit you may have with one of our providers in the next 365 days. If you have a MyChart account, a copy of this consent can be sent to you electronically.  As this is a virtual visit, video technology does not allow for your provider to perform a traditional examination. This may limit your provider's ability to fully assess your condition. If your provider identifies any concerns that need to be evaluated in person or the need to arrange testing (such as labs, EKG, etc.), we will make arrangements to do so. Although advances in technology are sophisticated, we cannot ensure that it will always work on either your end or our end. If the connection with a video visit is poor, the visit may have to be switched to a telephone visit. With either a video or telephone visit, we are not always able to ensure that we have a secure connection.  By engaging in this virtual visit, you consent to the provision of healthcare and authorize for your insurance to be billed (if applicable) for the services provided during this visit. Depending on your insurance coverage, you may receive a charge related to this service.  I need to obtain your verbal consent now. Are you willing to proceed with your visit today? Brenda Gutierrez has provided verbal consent on 05/14/2024 for a virtual visit (video or telephone). Brenda CHRISTELLA Dickinson, PA-C  Date: 05/14/2024 10:29 AM   Virtual Visit via Video Note   I, Brenda Gutierrez, connected with  Brenda Gutierrez  ([id number], 1985/09/16) on 05/14/24 at 10:00 AM EST by a video-enabled telemedicine application and verified that I am speaking with the correct person using two identifiers.  Location: Patient: Virtual  Visit Location Patient: Home Provider: Virtual Visit Location Provider: Home Office   I discussed the limitations of evaluation and management by telemedicine and the availability of in person appointments. The patient expressed understanding and agreed to proceed.    History of Present Illness: Brenda Gutierrez is a 39 y.o. who identifies as a female who was assigned female at birth, and is being seen today for recurrent sinusitis. Completed an EV on 05/06/24 for same symptoms. At that time symptoms had been present for almost 3 weeks. She was placed on Doxycycline  and reports symptom improvement on the left, but the right frontal and maxillary sinus are still very congested and swollen, causing migrainous headaches. Has been trying to work, but even light physical activity causes her to break out in a sweat. Denies recurrent fevers, chills, nausea, vomiting.    Problems:  Patient Active Problem List   Diagnosis Date Noted   Urinary urgency 12/27/2022   Upper respiratory tract infection 04/01/2022   Eustachian tube dysfunction, bilateral 04/01/2022   Sinus pressure 04/01/2022   Acute non-recurrent frontal sinusitis 02/04/2022   Menses painful 10/19/2021   PCOS (polycystic ovarian syndrome) 10/19/2021   Obesity (BMI 30-39.9) 08/17/2021   Generalized anxiety disorder 08/17/2021   Goiter 07/15/2021   Hirsutism 07/15/2021   Acne vulgaris 07/15/2021   Alopecia 07/12/2021   Absolute anemia 07/12/2021   Family history of diabetes mellitus in mother 07/12/2021   Vitamin D  deficiency 07/12/2021   BMI 39.0-39.9,adult 07/12/2021   Anxiety 02/23/2018  Allergies: Allergies[1] Medications: Current Medications[2]  Observations/Objective: Patient is well-developed, well-nourished in no acute distress.  Resting comfortably  Head is normocephalic, atraumatic.  No labored breathing.  Speech is clear and coherent with logical content.  Patient is alert and oriented at baseline.  Right  maxillary sinus is visibly swollen, larger than left cheek  Assessment and Plan: 1. Acute bacterial sinusitis (Primary) - predniSONE  (DELTASONE ) 20 MG tablet; Take 2 tablets (40 mg total) by mouth daily with breakfast.  Dispense: 14 tablet; Refill: 0 - sulfamethoxazole -trimethoprim  (BACTRIM  DS) 800-160 MG tablet; Take 1 tablet by mouth 2 (two) times daily.  Dispense: 14 tablet; Refill: 0  2. Antibiotic-induced yeast infection - fluconazole  (DIFLUCAN ) 150 MG tablet; Take 1 tablet (150 mg total) by mouth every 3 (three) days as needed.  Dispense: 3 tablet; Refill: 0  - Worsening symptoms that have not responded to OTC medications or Doxycycline  completely - Will give Bactrim  and Prednisone  - Continue allergy medications.  - Steam and humidifier can help - Stay well hydrated and get plenty of rest.  - Diflucan  given as prophylaxis as patient tends to get vaginal yeast infections with antibiotic use. - Seek in person evaluation if no symptom improvement or if symptoms worsen   Follow Up Instructions: I discussed the assessment and treatment plan with the patient. The patient was provided an opportunity to ask questions and all were answered. The patient agreed with the plan and demonstrated an understanding of the instructions.  A copy of instructions were sent to the patient via MyChart unless otherwise noted below.    The patient was advised to call back or seek an in-person evaluation if the symptoms worsen or if the condition fails to improve as anticipated.    Brenda CHRISTELLA Dickinson, PA-C     [1]  Allergies Allergen Reactions   Bee Pollen Anaphylaxis   [employer] Agent (Non-Screening)] Anaphylaxis   Insect Extract Anaphylaxis   Penicillin G Anaphylaxis, Swelling and Hives   Penicillins Anaphylaxis    Has patient had a PCN reaction causing immediate rash, facial/tongue/throat swelling, SOB or lightheadedness with hypotension: Yes Has patient had a PCN reaction  causing severe rash involving mucus membranes or skin necrosis: Yes Has patient had a PCN reaction that required hospitalization: No Has patient had a PCN reaction occurring within the last 10 years: Yes If all of the above answers are NO, then may proceed with Cephalosporin use.    Adhesive [Tape]    Amoxicillin-Pot Clavulanate Hives   Red Dye #40 (Allura Red) Other (See Comments)    Gi upset   Latex Rash  [2]  Current Outpatient Medications:    fluconazole  (DIFLUCAN ) 150 MG tablet, Take 1 tablet (150 mg total) by mouth every 3 (three) days as needed., Disp: 3 tablet, Rfl: 0   predniSONE  (DELTASONE ) 20 MG tablet, Take 2 tablets (40 mg total) by mouth daily with breakfast., Disp: 14 tablet, Rfl: 0   sulfamethoxazole -trimethoprim  (BACTRIM  DS) 800-160 MG tablet, Take 1 tablet by mouth 2 (two) times daily., Disp: 14 tablet, Rfl: 0   amphetamine -dextroamphetamine  (ADDERALL XR) 30 MG 24 hr capsule, Take 1 capsule (30 mg total) by mouth daily., Disp: 30 capsule, Rfl: 0   busPIRone  (BUSPAR ) 5 MG tablet, Take 1 tablet (5 mg total) by mouth 2 (two) times daily., Disp: 60 tablet, Rfl: 1   levocetirizine (XYZAL) 5 MG tablet, Take 5 mg by mouth every evening., Disp: , Rfl:    Nutritional Supplements (NUTRITIONAL SUPPLEMENT PO), Take by mouth.  MYO-INOSITOL 12 drop by mouth daily, Disp: , Rfl:    propranolol  (INDERAL ) 60 MG tablet, Take 1 tablet (60 mg total) by mouth daily., Disp: 60 tablet, Rfl: 2   propranolol  (INDERAL ) 60 MG tablet, Take 1 tablet (60 mg total) by mouth daily., Disp: 60 tablet, Rfl: 2  "

## 2024-05-14 NOTE — Progress Notes (Signed)
" ° °  Thank you for the details you included in the comment boxes. Those details are very helpful in determining the best course of treatment for you and help us  to provide the best care.Because Ms. Ruthellen, we recommend that you schedule a Virtual Urgent Care video visit in order for the provider to better assess what is going on.  The provider will be able to give you a more accurate diagnosis and treatment plan if we can more freely discuss your symptoms and with the addition of a virtual examination.   If you change your visit to a video visit, we will bill your insurance (similar to an office visit) and you will not be charged for this e-Visit. You will be able to stay at home and speak with the first available [facility] advanced practice provider. The link to do a video visit is in the drop down Menu tab of your Welcome screen in MyChart.    "

## 2024-05-14 NOTE — Telephone Encounter (Signed)
 Copied from CRM #[id number]. Topic: Clinical - Medical Advice >> May 14, 2024 12:27 PM Terri MATSU wrote: Reason for CRM: Patient saw Laymon Core on 1/9 since Tabitha didn't have anything regarding a lump in patients breast and she referred her to breast imaging to do a mammogram on 1/14 and they advised her that Tabitha needs to write an order to breast imaging to do a biopsy asap.

## 2024-05-14 NOTE — Patient Instructions (Signed)
 " Brenda Gutierrez, thank you for joining Delon CHRISTELLA Dickinson, PA-C for today's virtual visit.  While this provider is not your primary care provider (PCP), if your PCP is located in our provider database this encounter information will be shared with them immediately following your visit.   A [facility] MyChart account gives you access to today's visit and all your visits, tests, and labs performed at [facility]  click here if you don't have a [facility] MyChart account or go to [email or web address]  Consent: (Patient) Brenda Gutierrez provided verbal consent for this virtual visit at the beginning of the encounter.  Current Medications:  Current Outpatient Medications:    fluconazole  (DIFLUCAN ) 150 MG tablet, Take 1 tablet (150 mg total) by mouth every 3 (three) days as needed., Disp: 3 tablet, Rfl: 0   predniSONE  (DELTASONE ) 20 MG tablet, Take 2 tablets (40 mg total) by mouth daily with breakfast., Disp: 14 tablet, Rfl: 0   sulfamethoxazole -trimethoprim  (BACTRIM  DS) 800-160 MG tablet, Take 1 tablet by mouth 2 (two) times daily., Disp: 14 tablet, Rfl: 0   amphetamine -dextroamphetamine  (ADDERALL XR) 30 MG 24 hr capsule, Take 1 capsule (30 mg total) by mouth daily., Disp: 30 capsule, Rfl: 0   busPIRone  (BUSPAR ) 5 MG tablet, Take 1 tablet (5 mg total) by mouth 2 (two) times daily., Disp: 60 tablet, Rfl: 1   levocetirizine (XYZAL) 5 MG tablet, Take 5 mg by mouth every evening., Disp: , Rfl:    Nutritional Supplements (NUTRITIONAL SUPPLEMENT PO), Take by mouth. MYO-INOSITOL 12 drop by mouth daily, Disp: , Rfl:    propranolol  (INDERAL ) 60 MG tablet, Take 1 tablet (60 mg total) by mouth daily., Disp: 60 tablet, Rfl: 2   propranolol  (INDERAL ) 60 MG tablet, Take 1 tablet (60 mg total) by mouth daily., Disp: 60 tablet, Rfl: 2   Medications ordered in this encounter:  Meds ordered this encounter  Medications   predniSONE  (DELTASONE ) 20 MG tablet    Sig: Take 2 tablets (40 mg  total) by mouth daily with breakfast.    Dispense:  14 tablet    Refill:  0    Supervising Provider:   LAMPTEY, PHILIP O [[id number]]   sulfamethoxazole -trimethoprim  (BACTRIM  DS) 800-160 MG tablet    Sig: Take 1 tablet by mouth 2 (two) times daily.    Dispense:  14 tablet    Refill:  0    Supervising Provider:   LAMPTEY, PHILIP O [[id number]]   fluconazole  (DIFLUCAN ) 150 MG tablet    Sig: Take 1 tablet (150 mg total) by mouth every 3 (three) days as needed.    Dispense:  3 tablet    Refill:  0    Supervising Provider:   LAMPTEY, PHILIP O [[id number]]     *If you need refills on other medications prior to your next appointment, please contact your pharmacy*  Follow-Up: Call back or seek an in-person evaluation if the symptoms worsen or if the condition fails to improve as anticipated.  [facility] [phone]  Other Instructions Sinus Infection, Adult A sinus infection, also called sinusitis, is inflammation of your sinuses. Sinuses are hollow spaces in the bones around your face. Your sinuses are located: Around your eyes. In the middle of your forehead. Behind your nose. In your cheekbones. Mucus normally drains out of your sinuses. When your nasal tissues become inflamed or swollen, mucus can become trapped or blocked. This allows bacteria, viruses, and fungi to grow, which leads to infection. Most infections of  the sinuses are caused by a virus. A sinus infection can develop quickly. It can last for up to 4 weeks (acute) or for more than 12 weeks (chronic). A sinus infection often develops after a cold. What are the causes? This condition is caused by anything that creates swelling in the sinuses or stops mucus from draining. This includes: Allergies. Asthma. Infection from bacteria or viruses. Deformities or blockages in your nose or sinuses. Abnormal growths in the nose (nasal polyps). Pollutants, such as chemicals or irritants in the air. Infection from fungi.  This is rare. What increases the risk? You are more likely to develop this condition if you: Have a weak body defense system (immune system). Do a lot of swimming or diving. Overuse nasal sprays. Smoke. What are the signs or symptoms? The main symptoms of this condition are pain and a feeling of pressure around the affected sinuses. Other symptoms include: Stuffy nose or congestion that makes it difficult to breathe through your nose. Thick yellow or greenish drainage from your nose. Tenderness, swelling, and warmth over the affected sinuses. A cough that may get worse at night. Decreased sense of smell and taste. Extra mucus that collects in the throat or the back of the nose (postnasal drip) causing a sore throat or bad breath. Tiredness (fatigue). Fever. How is this diagnosed? This condition is diagnosed based on: Your symptoms. Your medical history. A physical exam. Tests to find out if your condition is acute or chronic. This may include: Checking your nose for nasal polyps. Viewing your sinuses using a device that has a light (endoscope). Testing for allergies or bacteria. Imaging tests, such as an MRI or CT scan. In rare cases, a bone biopsy may be done to rule out more serious types of fungal sinus disease. How is this treated? Treatment for a sinus infection depends on the cause and whether your condition is chronic or acute. If caused by a virus, your symptoms should go away on their own within 10 days. You may be given medicines to relieve symptoms. They include: Medicines that shrink swollen nasal passages (decongestants). A spray that eases inflammation of the nostrils (topical intranasal corticosteroids). Rinses that help get rid of thick mucus in your nose (nasal saline washes). Medicines that treat allergies (antihistamines). Over-the-counter pain relievers. If caused by bacteria, your health care provider may recommend waiting to see if your symptoms improve. Most  bacterial infections will get better without antibiotic medicine. You may be given antibiotics if you have: A severe infection. A weak immune system. If caused by narrow nasal passages or nasal polyps, surgery may be needed. Follow these instructions at home: Medicines Take, use, or apply over-the-counter and prescription medicines only as told by your health care provider. These may include nasal sprays. If you were prescribed an antibiotic medicine, take it as told by your health care provider. Do not stop taking the antibiotic even if you start to feel better. Hydrate and humidify  Drink enough fluid to keep your urine pale yellow. Staying hydrated will help to thin your mucus. Use a cool mist humidifier to keep the humidity level in your home above 50%. Inhale steam for 10-15 minutes, 3-4 times a day, or as told by your health care provider. You can do this in the bathroom while a hot shower is running. Limit your exposure to cool or dry air. Rest Rest as much as possible. Sleep with your head raised (elevated). Make sure you get enough sleep each night. General  instructions  Apply a warm, moist washcloth to your face 3-4 times a day or as told by your health care provider. This will help with discomfort. Use nasal saline washes as often as told by your health care provider. Wash your hands often with soap and water to reduce your exposure to germs. If soap and water are not available, use hand sanitizer. Do not smoke. Avoid being around people who are smoking (secondhand smoke). Keep all follow-up visits. This is important. Contact a health care provider if: You have a fever. Your symptoms get worse. Your symptoms do not improve within 10 days. Get help right away if: You have a severe headache. You have persistent vomiting. You have severe pain or swelling around your face or eyes. You have vision problems. You develop confusion. Your neck is stiff. You have trouble  breathing. These symptoms may be an emergency. Get help right away. Call 911. Do not wait to see if the symptoms will go away. Do not drive yourself to the hospital. Summary A sinus infection is soreness and inflammation of your sinuses. Sinuses are hollow spaces in the bones around your face. This condition is caused by nasal tissues that become inflamed or swollen. The swelling traps or blocks the flow of mucus. This allows bacteria, viruses, and fungi to grow, which leads to infection. If you were prescribed an antibiotic medicine, take it as told by your health care provider. Do not stop taking the antibiotic even if you start to feel better. Keep all follow-up visits. This is important. This information is not intended to replace advice given to you by your health care provider. Make sure you discuss any questions you have with your health care provider. Document Revised: 03/13/2021 Document Reviewed: 03/13/2021 Elsevier Patient Education  2024 [employer]   If you have been instructed to have an in-person evaluation today at a local Urgent Care facility, please use the link below. It will take you to a list of all of our available [facility], including address, phone number and hours of operation. Please do not delay care.  [facility]  If you or a family member do not have a primary care provider, use the link below to schedule a visit and establish care. When you choose a [facility] primary care physician or advanced practice provider, you gain a long-term partner in health. Find a Primary Care Provider  Learn more about [facility]'s in-office and virtual care options:  - Get Care Now "

## 2024-05-20 ENCOUNTER — Other Ambulatory Visit: Payer: Self-pay

## 2024-05-20 ENCOUNTER — Other Ambulatory Visit: Payer: Self-pay | Admitting: Family Medicine

## 2024-05-20 DIAGNOSIS — R928 Other abnormal and inconclusive findings on diagnostic imaging of breast: Secondary | ICD-10-CM

## 2024-06-01 ENCOUNTER — Encounter

## 2024-06-01 ENCOUNTER — Other Ambulatory Visit

## 2024-06-01 ENCOUNTER — Ambulatory Visit: Admission: RE | Admit: 2024-06-01 | Source: Ambulatory Visit
